# Patient Record
Sex: Female | Born: 1990 | Race: Black or African American | Hispanic: No | Marital: Single | Smoking: Never smoker
Health system: Southern US, Community
[De-identification: ages and names within clinical notes are randomized; demographics above are authoritative.]

## PROBLEM LIST (undated history)

## (undated) DIAGNOSIS — R519 Headache, unspecified: Secondary | ICD-10-CM

## (undated) DIAGNOSIS — J45909 Unspecified asthma, uncomplicated: Secondary | ICD-10-CM

## (undated) DIAGNOSIS — R51 Headache: Secondary | ICD-10-CM

## (undated) HISTORY — PX: NO PAST SURGERIES: SHX2092

---

## 2015-06-04 NOTE — L&D Delivery Note (Signed)
Delivery Note After 10  Minutes of pushing, at 9:36 PM a viable female was delivered via Vaginal, Spontaneous Delivery (Presentation: OA).  APGAR: 8, 9;  After 2 minutes, the cord was clamped and cut. 40 units of pitocin diluted in 1000cc LR was infused rapidly IV.  The placenta separated spontaneously and delivered via CCT and maternal pushing effort.  It was inspected and appears to be intact with a 3 VC.   Placenta status: delivered spontaneously in tact Cord: 3 vessel, without complication.   Anesthesia:  Epidural Episiotomy: None Lacerations: None Suture Repair: NA Est. Blood Loss (mL): 18  Mom to postpartum.  Baby to Couplet care / Skin to Skin.  All instruments and gauze accounted for.  Andres Ege, MD, PGY-1, MPH 01/04/2016, 9:49 PM  I was present for the above and agree.  CRESENZO-DISHMAN,Crystian Frith

## 2015-10-03 ENCOUNTER — Encounter: Payer: Self-pay | Admitting: Certified Nurse Midwife

## 2015-10-03 ENCOUNTER — Ambulatory Visit (INDEPENDENT_AMBULATORY_CARE_PROVIDER_SITE_OTHER): Payer: Medicaid Other | Admitting: Certified Nurse Midwife

## 2015-10-03 VITALS — BP 125/80 | HR 106 | Wt 191.0 lb

## 2015-10-03 DIAGNOSIS — J45909 Unspecified asthma, uncomplicated: Secondary | ICD-10-CM

## 2015-10-03 DIAGNOSIS — J4520 Mild intermittent asthma, uncomplicated: Secondary | ICD-10-CM | POA: Diagnosis not present

## 2015-10-03 DIAGNOSIS — K5901 Slow transit constipation: Secondary | ICD-10-CM | POA: Diagnosis not present

## 2015-10-03 DIAGNOSIS — O9989 Other specified diseases and conditions complicating pregnancy, childbirth and the puerperium: Secondary | ICD-10-CM | POA: Diagnosis not present

## 2015-10-03 DIAGNOSIS — Z3492 Encounter for supervision of normal pregnancy, unspecified, second trimester: Secondary | ICD-10-CM

## 2015-10-03 DIAGNOSIS — O219 Vomiting of pregnancy, unspecified: Secondary | ICD-10-CM

## 2015-10-03 DIAGNOSIS — Z3482 Encounter for supervision of other normal pregnancy, second trimester: Secondary | ICD-10-CM | POA: Insufficient documentation

## 2015-10-03 DIAGNOSIS — O99519 Diseases of the respiratory system complicating pregnancy, unspecified trimester: Secondary | ICD-10-CM

## 2015-10-03 LAB — POCT URINALYSIS DIPSTICK
Bilirubin, UA: NEGATIVE
Blood, UA: NEGATIVE
Glucose, UA: NEGATIVE
KETONES UA: NEGATIVE
NITRITE UA: NEGATIVE
PH UA: 5
Spec Grav, UA: 1.02
Urobilinogen, UA: 1

## 2015-10-03 MED ORDER — FUSION PLUS PO CAPS: 1.0000 | ORAL_CAPSULE | Freq: Every day | ORAL | Status: DC

## 2015-10-03 MED ORDER — DOCUSATE SODIUM 100 MG PO CAPS: 100.0000 mg | ORAL_CAPSULE | Freq: Two times a day (BID) | ORAL | Status: DC

## 2015-10-03 MED ORDER — ONDANSETRON HCL 4 MG PO TABS: 4.0000 mg | ORAL_TABLET | Freq: Every day | ORAL | Status: DC | PRN

## 2015-10-03 MED ORDER — ALBUTEROL SULFATE HFA 108 (90 BASE) MCG/ACT IN AERS: 2.0000 | INHALATION_SPRAY | Freq: Four times a day (QID) | RESPIRATORY_TRACT | Status: AC | PRN

## 2015-10-03 MED ORDER — VITAFOL GUMMIES 3.33-0.333-34.8 MG PO CHEW: 3.0000 | CHEWABLE_TABLET | Freq: Every day | ORAL | Status: DC

## 2015-10-03 NOTE — Progress Notes (Signed)
Subjective:    Georga BoraClayvonna Brownrigg is being seen today for her first obstetrical visit.  This is not a planned pregnancy. She is at 6967w2d gestation. Her obstetrical history is significant for Asthma, not using any medications. Relationship with FOB: not involved. Patient does intend to breast feed. Pregnancy history fully reviewed.  Not currently employed.    The information documented in the HPI was reviewed and verified.  Menstrual History: OB History    Gravida Para Term Preterm AB TAB SAB Ectopic Multiple Living   3 1 1  1     1      NSVD: AGA.    Patient's last menstrual period was 04/16/2015.    No past medical history on file.  No past surgical history on file.   (Not in a hospital admission) Not on File  Social History  Substance Use Topics  . Smoking status: Not on file  . Smokeless tobacco: Not on file  . Alcohol Use: Not on file    No family history on file.   Review of Systems Constitutional: negative for weight loss Gastrointestinal: negative for vomiting Genitourinary:negative for genital lesions and vaginal discharge and dysuria Musculoskeletal:negative for back pain Behavioral/Psych: negative for abusive relationship, depression, illegal drug usage and tobacco use    Objective:    BP 125/80 mmHg  Pulse 106  Wt 191 lb (86.637 kg)  LMP 04/16/2015 General Appearance:    Alert, cooperative, no distress, appears stated age  Head:    Normocephalic, without obvious abnormality, atraumatic  Eyes:    PERRL, conjunctiva/corneas clear, EOM's intact, fundi    benign, both eyes  Ears:    Normal TM's and external ear canals, both ears  Nose:   Nares normal, septum midline, mucosa normal, no drainage    or sinus tenderness  Throat:   Lips, mucosa, and tongue normal; teeth and gums normal  Neck:   Supple, symmetrical, trachea midline, no adenopathy;    thyroid:  no enlargement/tenderness/nodules; no carotid   bruit or JVD  Back:     Symmetric, no curvature, ROM normal,  no CVA tenderness  Lungs:     Clear to auscultation bilaterally, respirations unlabored  Chest Wall:    No tenderness or deformity   Heart:    Regular rate and rhythm, S1 and S2 normal, no murmur, rub   or gallop  Breast Exam:    No tenderness, masses, or nipple abnormality  Abdomen:     Soft, non-tender, bowel sounds active all four quadrants,    no masses, no organomegaly  Genitalia:    Normal female without lesion, discharge or tenderness  Extremities:   Extremities normal, atraumatic, no cyanosis or edema  Pulses:   2+ and symmetric all extremities  Skin:   Skin color, texture, turgor normal, no rashes or lesions  Lymph nodes:   Cervical, supraclavicular, and axillary nodes normal  Neurologic:   CNII-XII intact, normal strength, sensation and reflexes    throughout      Lab Review Urine pregnancy test Labs reviewed no Radiologic studies reviewed no Assessment:    Pregnancy at 4367w2d weeks   Transfer from [facility]:XWRUEAVJ:records requested  Plan:      Prenatal vitamins.  Counseling provided regarding continued use of seat belts, cessation of alcohol consumption, smoking or use of illicit drugs; infection precautions i.e., influenza/TDAP immunizations, toxoplasmosis,CMV, parvovirus, listeria and varicella; workplace safety, exercise during pregnancy; routine dental care, safe medications, sexual activity, hot tubs, saunas, pools, travel, caffeine use, fish and methlymercury, potential toxins,  hair treatments, varicose veins Weight gain recommendations per IOM guidelines reviewed: underweight/BMI< 18.5--> gain 28 - 40 lbs; normal weight/BMI 18.5 - 24.9--> gain 25 - 35 lbs; overweight/BMI 25 - 29.9--> gain 15 - 25 lbs; obese/BMI >30->gain  11 - 20 lbs Problem list reviewed and updated. FIRST/CF mutation testing/NIPT/QUAD SCREEN/fragile X/Ashkenazi Jewish population testing/Spinal muscular atrophy discussed: results reviewed. Role of ultrasound in pregnancy discussed; fetal survey: no records  presently, will review when recieved. Amniocentesis discussed: not indicated. VBAC calculator score: VBAC consent form provided Meds ordered this encounter  Medications  . Prenatal MV-Min-Fe Fum-FA-DHA (PRENATAL 1 PO)    Sig: Take by mouth.   Orders Placed This Encounter  Procedures  . POCT urinalysis dipstick    Follow up in 4 weeks with OGTT. 50% of 30 min visit spent on counseling and coordination of care.

## 2015-10-05 LAB — PRENATAL PROFILE I(LABCORP)
ANTIBODY SCREEN: NEGATIVE
BASOS: 0 %
Basophils Absolute: 0 10*3/uL (ref 0.0–0.2)
EOS (ABSOLUTE): 0.1 10*3/uL (ref 0.0–0.4)
EOS: 1 %
HEMATOCRIT: 33.7 % — AB (ref 34.0–46.6)
HEP B S AG: NEGATIVE
Hemoglobin: 11.3 g/dL (ref 11.1–15.9)
IMMATURE GRANS (ABS): 0.1 10*3/uL (ref 0.0–0.1)
Immature Granulocytes: 1 %
LYMPHS: 14 %
Lymphocytes Absolute: 1.6 10*3/uL (ref 0.7–3.1)
MCH: 28.9 pg (ref 26.6–33.0)
MCHC: 33.5 g/dL (ref 31.5–35.7)
MCV: 86 fL (ref 79–97)
MONOCYTES: 7 %
Monocytes Absolute: 0.8 10*3/uL (ref 0.1–0.9)
Neutrophils Absolute: 9 10*3/uL — ABNORMAL HIGH (ref 1.4–7.0)
Neutrophils: 77 %
Platelets: 306 10*3/uL (ref 150–379)
RBC: 3.91 x10E6/uL (ref 3.77–5.28)
RDW: 14.5 % (ref 12.3–15.4)
RPR: NONREACTIVE
RUBELLA: 4.25 {index} (ref >0.99)
Rh Factor: POSITIVE
WBC: 11.6 10*3/uL — ABNORMAL HIGH (ref 3.4–10.8)

## 2015-10-05 LAB — CULTURE, OB URINE

## 2015-10-05 LAB — HEMOGLOBINOPATHY EVALUATION
HEMOGLOBIN A2 QUANTITATION: 2.3 % (ref 0.7–3.1)
HGB C: 0 %
HGB S: 0 %
Hemoglobin F Quantitation: 0 % (ref 0.0–2.0)
Hgb A: 97.7 % (ref 94.0–98.0)

## 2015-10-05 LAB — VARICELLA ZOSTER ANTIBODY, IGG: Varicella zoster IgG: <135 {index} — ABNORMAL LOW

## 2015-10-05 LAB — URINE CULTURE, OB REFLEX

## 2015-10-05 LAB — TSH: TSH: 1.51 u[IU]/mL (ref 0.450–4.500)

## 2015-10-31 ENCOUNTER — Other Ambulatory Visit: Payer: Medicaid Other

## 2015-10-31 ENCOUNTER — Ambulatory Visit (INDEPENDENT_AMBULATORY_CARE_PROVIDER_SITE_OTHER): Payer: Medicaid Other | Admitting: Certified Nurse Midwife

## 2015-10-31 VITALS — BP 123/77 | HR 79 | Wt 195.0 lb

## 2015-10-31 DIAGNOSIS — Z3483 Encounter for supervision of other normal pregnancy, third trimester: Secondary | ICD-10-CM

## 2015-10-31 LAB — POCT URINALYSIS DIPSTICK
Bilirubin, UA: NEGATIVE
Blood, UA: NEGATIVE
Glucose, UA: NEGATIVE
KETONES UA: NEGATIVE
LEUKOCYTES UA: NEGATIVE
Nitrite, UA: NEGATIVE
PH UA: 6
PROTEIN UA: NEGATIVE
Spec Grav, UA: 1.01
UROBILINOGEN UA: NEGATIVE

## 2015-10-31 NOTE — Progress Notes (Signed)
Subjective:    Joan Francis is a 25 y.o. female being seen today for her obstetrical visit. She is at 2129w2d gestation. Patient reports backache, no bleeding, no cramping, no leaking and occasional contractions. Fetal movement: normal.  Problem List Items Addressed This Visit    None     Patient Active Problem List   Diagnosis Date Noted  . Encounter for supervision of other normal pregnancy in second trimester 10/03/2015  . Asthma affecting pregnancy, antepartum 10/03/2015   Objective:    BP 123/77 mmHg  Pulse 79  Wt 195 lb (88.451 kg)  LMP 04/16/2015 FHT:  140 BPM  Uterine Size: size equals dates  Presentation: cephalic     Assessment:    Pregnancy @ 229w2d weeks   Round ligament of pregnancy  Plan:    Rx: maternity support belt   labs reviewed, problem list updated Consent signed. GBS sent TDAP offered  Rhogam given for RH negative Pediatrician: discussed. Infant feeding: plans to breastfeed. Maternity leave: discussed. Cigarette smoking: never smoked. No orders of the defined types were placed in this encounter.   No orders of the defined types were placed in this encounter.   Follow up in 2 Weeks.

## 2015-10-31 NOTE — Progress Notes (Signed)
Pt has some lower abdominal pain/tenderness.

## 2015-11-01 ENCOUNTER — Other Ambulatory Visit: Payer: Self-pay | Admitting: Certified Nurse Midwife

## 2015-11-01 LAB — CBC
HEMATOCRIT: 33.1 % — AB (ref 34.0–46.6)
Hemoglobin: 10.7 g/dL — ABNORMAL LOW (ref 11.1–15.9)
MCH: 27.6 pg (ref 26.6–33.0)
MCHC: 32.3 g/dL (ref 31.5–35.7)
MCV: 86 fL (ref 79–97)
Platelets: 300 10*3/uL (ref 150–379)
RBC: 3.87 x10E6/uL (ref 3.77–5.28)
RDW: 14.8 % (ref 12.3–15.4)
WBC: 9.9 10*3/uL (ref 3.4–10.8)

## 2015-11-01 LAB — GLUCOSE TOLERANCE, 2 HOURS W/ 1HR
Glucose, 1 hour: 107 mg/dL (ref 65–179)
Glucose, 2 hour: 98 mg/dL (ref 65–152)
Glucose, Fasting: 76 mg/dL (ref 65–91)

## 2015-11-01 LAB — RPR: RPR Ser Ql: NONREACTIVE

## 2015-11-01 LAB — HIV ANTIBODY (ROUTINE TESTING W REFLEX): HIV Screen 4th Generation wRfx: NONREACTIVE

## 2015-11-06 ENCOUNTER — Ambulatory Visit (HOSPITAL_COMMUNITY)
Admission: RE | Admit: 2015-11-06 | Discharge: 2015-11-06 | Disposition: A | Discharge status: Home / Self Care | Payer: Medicaid Other | Source: Ambulatory Visit | Attending: Certified Nurse Midwife | Admitting: Certified Nurse Midwife

## 2015-11-06 DIAGNOSIS — Z3483 Encounter for supervision of other normal pregnancy, third trimester: Secondary | ICD-10-CM

## 2015-11-06 DIAGNOSIS — J45909 Unspecified asthma, uncomplicated: Secondary | ICD-10-CM | POA: Insufficient documentation

## 2015-11-06 DIAGNOSIS — O99513 Diseases of the respiratory system complicating pregnancy, third trimester: Secondary | ICD-10-CM | POA: Insufficient documentation

## 2015-11-06 DIAGNOSIS — Z36 Encounter for antenatal screening of mother: Secondary | ICD-10-CM | POA: Insufficient documentation

## 2015-11-06 DIAGNOSIS — Z3A29 29 weeks gestation of pregnancy: Secondary | ICD-10-CM | POA: Diagnosis not present

## 2015-11-07 ENCOUNTER — Other Ambulatory Visit: Payer: Self-pay | Admitting: Certified Nurse Midwife

## 2015-11-14 ENCOUNTER — Ambulatory Visit (INDEPENDENT_AMBULATORY_CARE_PROVIDER_SITE_OTHER): Payer: Medicaid Other | Admitting: Certified Nurse Midwife

## 2015-11-14 VITALS — BP 125/80 | HR 83 | Wt 197.0 lb

## 2015-11-14 DIAGNOSIS — Z3483 Encounter for supervision of other normal pregnancy, third trimester: Secondary | ICD-10-CM

## 2015-11-14 LAB — POCT URINALYSIS DIPSTICK
BILIRUBIN UA: NEGATIVE
GLUCOSE UA: NEGATIVE
Ketones, UA: NEGATIVE
NITRITE UA: NEGATIVE
Protein, UA: NEGATIVE
RBC UA: NEGATIVE
Spec Grav, UA: <=1.005
UROBILINOGEN UA: NEGATIVE
pH, UA: 6

## 2015-11-14 NOTE — Progress Notes (Signed)
I agree with note by NP Student Peri MarisAndrew Jojo Geving.  Was present for exam.  R.Denney CNM  Subjective:    Joan Francis is a 25 y.o. female being seen today for her obstetrical visit. She is at 6515w2d gestation. Patient reports backache, no bleeding, no leaking and occasional contractions. Fetal movement: normal.  Patient expressed interest in tubal ligation.  Educated patient about requirements and risk with tubal ligation.  Desires IUD (Mirena or Paraguard) after delivery.    Problem List Items Addressed This Visit    None    Visit Diagnoses    Encounter for supervision of other normal pregnancy in third trimester    -  Primary    Relevant Orders    POCT urinalysis dipstick (Completed)      Patient Active Problem List   Diagnosis Date Noted  . Encounter for supervision of other normal pregnancy in second trimester 10/03/2015  . Asthma affecting pregnancy, antepartum 10/03/2015   Objective:    BP 125/80 mmHg  Pulse 83  Wt 197 lb (89.359 kg)  LMP 04/16/2015 FHT:  150 BPM  Uterine Size: size equals dates  Presentation: breech     Assessment:    Pregnancy @ 5615w2d weeks    Doing well   Plan:     labs reviewed, problem list updated Consent signed. GBS planning TDAP offered  Rhogam given for RH negative Pediatrician: discussed.  Orders Placed This Encounter  Procedures  . POCT urinalysis dipstick   No orders of the defined types were placed in this encounter.   Follow up in 2 Weeks.

## 2015-11-14 NOTE — Progress Notes (Signed)
Subjective:    Georga BoraClayvonna Creswell is a 25 y.o. female being seen today for her obstetrical visit. She is at 6044w2d gestation. Patient reports no complaints. Fetal movement: normal.  Problem List Items Addressed This Visit    None    Visit Diagnoses    Encounter for supervision of other normal pregnancy in third trimester    -  Primary    Relevant Orders    POCT urinalysis dipstick (Completed)      Patient Active Problem List   Diagnosis Date Noted  . Encounter for supervision of other normal pregnancy in second trimester 10/03/2015  . Asthma affecting pregnancy, antepartum 10/03/2015   Objective:    BP 125/80 mmHg  Pulse 83  Wt 197 lb (89.359 kg)  LMP 04/16/2015             Assessment:    Pregnancy @ 9344w2d weeks   Doing well  Plan:     labs reviewed, problem list updated Consent signed. GBS planning TDAP offered  Rhogam given for RH negative Pediatrician: discussed. Infant feeding: plans to breastfeed. Maternity leave: discussed. Cigarette smoking: never smoked. Orders Placed This Encounter  Procedures  . POCT urinalysis dipstick   No orders of the defined types were placed in this encounter.   Follow up in 2 Weeks.

## 2015-11-28 ENCOUNTER — Ambulatory Visit (INDEPENDENT_AMBULATORY_CARE_PROVIDER_SITE_OTHER): Payer: Medicaid Other | Admitting: Certified Nurse Midwife

## 2015-11-28 VITALS — BP 116/60 | HR 91 | Wt 197.0 lb

## 2015-11-28 DIAGNOSIS — K219 Gastro-esophageal reflux disease without esophagitis: Secondary | ICD-10-CM

## 2015-11-28 DIAGNOSIS — O99613 Diseases of the digestive system complicating pregnancy, third trimester: Secondary | ICD-10-CM

## 2015-11-28 DIAGNOSIS — Z3483 Encounter for supervision of other normal pregnancy, third trimester: Secondary | ICD-10-CM

## 2015-11-28 LAB — POCT URINALYSIS DIPSTICK
Bilirubin, UA: NEGATIVE
Blood, UA: NEGATIVE
GLUCOSE UA: NEGATIVE
KETONES UA: NEGATIVE
Nitrite, UA: NEGATIVE
Protein, UA: NEGATIVE
SPEC GRAV UA: 1.01
UROBILINOGEN UA: NEGATIVE
pH, UA: 6

## 2015-11-28 MED ORDER — OMEPRAZOLE 20 MG PO CPDR: 20.0000 mg | DELAYED_RELEASE_CAPSULE | Freq: Two times a day (BID) | ORAL | Status: DC

## 2015-11-28 NOTE — Progress Notes (Signed)
I agree with note by NP Student Andrew Brake.  Was present for exam.  R.Rennae Ferraiolo CNM 

## 2015-11-28 NOTE — Progress Notes (Signed)
Subjective:    Joan Francis iGeorga Boras a 25 y.o. female being seen today for her obstetrical visit. She is at 7669w2d gestation. Patient reports backache, fatigue, heartburn, no contractions, no cramping, no leaking and can't eat much, feels like getting the stomach flu, not feeling hungry, feeling hot, feels like her labia is going to tear, sharp pains on RLQ. Fetal movement: normal.  Problem List Items Addressed This Visit    None    Visit Diagnoses    Encounter for supervision of other normal pregnancy in third trimester    -  Primary    Relevant Orders    POCT urinalysis dipstick (Completed)    US MFM OB FOLLOW UP    Gastroesophageal reflux during pregnancy in third trimester, antepartum        Relevant Medications    omeprazole (PRILOSEC) 20 MG capsule      Patient Active Problem List   Diagnosis Date Noted  . Encounter for supervision of other normal pregnancy in second trimester 10/03/2015  . Asthma affecting pregnancy, antepartum 10/03/2015   Objective:    BP 153/84 mmHg  Pulse 91  Wt 197 lb (89.359 kg)  LMP 04/16/2015 FHT:  150 BPM  Uterine Size: size equals dates  Presentation: unsure     1 cm dilated.  Assessment:    Pregnancy @ 7969w2d weeks    Round ligament pain.  Normal pregnancy discomfort.  Plan:     labs reviewed, problem list updated Reinforce of maternity support belt & Tums. Consent signed. GBS sent TDAP offered  Rhogam given for RH negative Pediatrician: discussed. Infant feeding: plans to breastfeed. Maternity leave: discussed. Cigarette smoking: never smoked. Orders Placed This Encounter  Procedures  . US MFM OB FOLLOW UP    Standing Status: Future     Number of Occurrences:      Standing Expiration Date: 01/27/2017    Order Specific Question:  Reason for Exam (SYMPTOM  OR DIAGNOSIS REQUIRED)    Answer:  f/u US for growth    Order Specific Question:  Preferred imaging location?    Answer:  [facility]  . POCT urinalysis dipstick    Meds ordered this encounter  Medications  . omeprazole (PRILOSEC) 20 MG capsule    Sig: Take 1 capsule (20 mg total) by mouth 2 (two) times daily before a meal.    Dispense:  60 capsule    Refill:  4   Follow up in 2 Weeks.

## 2015-12-04 ENCOUNTER — Ambulatory Visit (HOSPITAL_COMMUNITY)
Admission: RE | Admit: 2015-12-04 | Discharge: 2015-12-04 | Disposition: A | Discharge status: Home / Self Care | Payer: Medicaid Other | Source: Ambulatory Visit | Attending: Certified Nurse Midwife | Admitting: Certified Nurse Midwife

## 2015-12-04 DIAGNOSIS — O9989 Other specified diseases and conditions complicating pregnancy, childbirth and the puerperium: Secondary | ICD-10-CM | POA: Insufficient documentation

## 2015-12-04 DIAGNOSIS — J45909 Unspecified asthma, uncomplicated: Secondary | ICD-10-CM | POA: Diagnosis present

## 2015-12-04 DIAGNOSIS — Z36 Encounter for antenatal screening of mother: Secondary | ICD-10-CM | POA: Diagnosis not present

## 2015-12-04 DIAGNOSIS — O0933 Supervision of pregnancy with insufficient antenatal care, third trimester: Secondary | ICD-10-CM | POA: Diagnosis not present

## 2015-12-04 DIAGNOSIS — Z3483 Encounter for supervision of other normal pregnancy, third trimester: Secondary | ICD-10-CM

## 2015-12-04 DIAGNOSIS — Z3A33 33 weeks gestation of pregnancy: Secondary | ICD-10-CM | POA: Diagnosis not present

## 2015-12-07 ENCOUNTER — Other Ambulatory Visit: Payer: Self-pay | Admitting: Certified Nurse Midwife

## 2015-12-12 ENCOUNTER — Ambulatory Visit (INDEPENDENT_AMBULATORY_CARE_PROVIDER_SITE_OTHER): Payer: Medicaid Other | Admitting: Certified Nurse Midwife

## 2015-12-12 VITALS — BP 130/83 | HR 106 | Wt 198.8 lb

## 2015-12-12 DIAGNOSIS — Z3483 Encounter for supervision of other normal pregnancy, third trimester: Secondary | ICD-10-CM

## 2015-12-12 LAB — POCT URINALYSIS DIPSTICK
BILIRUBIN UA: NEGATIVE
Blood, UA: NEGATIVE
Glucose, UA: NEGATIVE
Leukocytes, UA: NEGATIVE
Nitrite, UA: NEGATIVE
SPEC GRAV UA: 1.02
Urobilinogen, UA: NEGATIVE
pH, UA: 5

## 2015-12-12 NOTE — Progress Notes (Signed)
I agree with note by NP Student Andrew Brake.  Was present for exam.  R.Denney CNM 

## 2015-12-12 NOTE — Progress Notes (Signed)
Subjective:    Joan Francis is a 25 y.o. female being seen today for her obstetrical visit. She is at 7020w2d gestation. Patient reports backache, fatigue, no bleeding and no contractions.  Had contractions on 12-04-2015, took warm bath and felt better. Fetal movement: normal.  Problem List Items Addressed This Visit    None    Visit Diagnoses    Encounter for supervision of other normal pregnancy in third trimester    -  Primary    Relevant Orders    POCT urinalysis dipstick      Patient Active Problem List   Diagnosis Date Noted  . Encounter for supervision of other normal pregnancy in second trimester 10/03/2015  . Asthma affecting pregnancy, antepartum 10/03/2015   Objective:    BP 130/83 mmHg  Pulse 106  Wt 198 lb 12.8 oz (90.175 kg)  LMP 04/16/2015 FHT:  140 BPM  Uterine Size: size equals dates  Presentation: cephalic     Assessment:    Pregnancy @ 3320w2d weeks  Back pain in pregnancy. Plan:     labs reviewed, problem list updated Consent signed. GBS sent TDAP offered  Rhogam given for RH negative Pediatrician: discussed. Infant feeding: plans to breastfeed. Maternity leave: discussed.  Orders Placed This Encounter  Procedures  . POCT urinalysis dipstick   No orders of the defined types were placed in this encounter.   Follow up in 1 Weeks with GBS.

## 2015-12-20 ENCOUNTER — Ambulatory Visit (INDEPENDENT_AMBULATORY_CARE_PROVIDER_SITE_OTHER): Payer: Medicaid Other | Admitting: Certified Nurse Midwife

## 2015-12-20 VITALS — BP 119/77 | HR 101 | Temp 97.7°F | Wt 200.0 lb

## 2015-12-20 DIAGNOSIS — Z3483 Encounter for supervision of other normal pregnancy, third trimester: Secondary | ICD-10-CM

## 2015-12-20 LAB — POCT URINALYSIS DIPSTICK
Bilirubin, UA: NEGATIVE
Blood, UA: NEGATIVE
Glucose, UA: NEGATIVE
Nitrite, UA: NEGATIVE
PH UA: 5
SPEC GRAV UA: 1.025
UROBILINOGEN UA: NEGATIVE

## 2015-12-20 LAB — OB RESULTS CONSOLE GBS: STREP GROUP B AG: NEGATIVE

## 2015-12-20 NOTE — Progress Notes (Signed)
Subjective:    Joan Francis is a 25 y.o. female being seen today for her obstetrical visit. She is at 9898w3d gestation. Patient reports backache, no bleeding, no leaking, occasional contractions and insomnia; has not tried anything for the insomnia. Fetal movement: normal.  Problem List Items Addressed This Visit    None    Visit Diagnoses    Encounter for supervision of other normal pregnancy in third trimester    -  Primary    Relevant Orders    POCT urinalysis dipstick (Completed)      Patient Active Problem List   Diagnosis Date Noted  . Encounter for supervision of other normal pregnancy in second trimester 10/03/2015  . Asthma affecting pregnancy, antepartum 10/03/2015   Objective:    BP 119/77 mmHg  Pulse 101  Temp(Src) 97.7 F (36.5 C)  Wt 200 lb (90.719 kg)  LMP 04/16/2015 FHT:  145 BPM  Uterine Size: size equals dates  Presentation: cephalic   Cervix: 1 cm, posterior, medium, -3 station  Assessment:    Pregnancy @ 6898w3d weeks   Plan:     labs reviewed, problem list updated Consent signed. GBS sent TDAP offered  Rhogam given for RH negative Pediatrician: discussed. Infant feeding: plans to breastfeed. Maternity leave: discussed and forms done. Cigarette smoking: never smoked. Orders Placed This Encounter  Procedures  . POCT urinalysis dipstick   No orders of the defined types were placed in this encounter.   Follow up in 1 Week.

## 2015-12-20 NOTE — Assessment & Plan Note (Signed)
  Clinic  [facility] Prenatal Labs  Dating  LMP Blood type: O/Positive/-- (05/02 1513)   Genetic Screen 1 Screen:    AFP:     Quad:     NIPS: Antibody:Negative (05/02 1513)  Anatomic US  Normal  Rubella: 4.25 (05/02 1513)  GTT Early:               Third trimester:  RPR: Non Reactive (05/30 1040)   Flu vaccine  HBsAg: Negative (05/02 1513)   TDaP vaccine                                               Rhogam: N/A HIV: Non Reactive (05/30 1040)   Baby Food                   Breast                            GBS: (For PCN allergy, check sensitivities)  Contraception   IUD PP Pap:  Circumcision    Pediatrician    Support Person

## 2015-12-20 NOTE — Addendum Note (Signed)
Addended by: Marya LandryFOSTER, Correy Weidner D on: 12/20/2015 10:38 AM   Modules accepted: Orders

## 2015-12-22 LAB — STREP GP B NAA: Strep Gp B NAA: NEGATIVE

## 2015-12-27 LAB — NUSWAB VG+, CANDIDA 6SP
CANDIDA GLABRATA, NAA: NEGATIVE
CANDIDA KRUSEI, NAA: NEGATIVE
CANDIDA LUSITANIAE, NAA: NEGATIVE
CANDIDA PARAPSILOSIS, NAA: NEGATIVE
CHLAMYDIA TRACHOMATIS, NAA: NEGATIVE
Candida albicans, NAA: NEGATIVE
Candida tropicalis, NAA: NEGATIVE
NEISSERIA GONORRHOEAE, NAA: NEGATIVE
Trich vag by NAA: NEGATIVE

## 2015-12-28 ENCOUNTER — Ambulatory Visit (INDEPENDENT_AMBULATORY_CARE_PROVIDER_SITE_OTHER): Payer: Medicaid Other | Admitting: Certified Nurse Midwife

## 2015-12-28 VITALS — BP 119/79 | HR 105 | Temp 97.2°F | Wt 200.0 lb

## 2015-12-28 DIAGNOSIS — Z3483 Encounter for supervision of other normal pregnancy, third trimester: Secondary | ICD-10-CM

## 2015-12-28 DIAGNOSIS — Z3493 Encounter for supervision of normal pregnancy, unspecified, third trimester: Secondary | ICD-10-CM

## 2015-12-28 NOTE — Progress Notes (Signed)
Subjective:    Joan Francis is a 25 y.o. female being seen today for her obstetrical visit. She is at [redacted]w[redacted]d gestation. Patient reports no bleeding, no leaking and occasional contractions. Fetal movement: normal.  Problem List Items Addressed This Visit      Other   Encounter for supervision of other normal pregnancy in third trimester     Clinic  [facility] Prenatal Labs  Dating  LMP Blood type: O/Positive/-- (05/02 1513)   Genetic Screen 1 Screen:    AFP:     Quad:     NIPS: Antibody:Negative (05/02 1513)  Anatomic [place]  normal Rubella: 4.25 (05/02 1513)  GTT Early:               Third trimester: Normal 2 hr RPR: Non Reactive (05/30 1040)   Flu vaccine  HBsAg: Negative (05/02 1513)   TDaP vaccine                                               Rhogam:N/A HIV: Non Reactive (05/30 1040)   Baby Food                   Breast                            EBR:[id number] (07/19 1059)(For PCN allergy, check sensitivities)  Contraception  IUD Pap:  Circumcision  [facility]   Pediatrician  [facility]   Support Person  Aunt          Other Visit Diagnoses    Prenatal care in third trimester    -  Primary   Relevant Orders   POCT Urinalysis Dipstick     Patient Active Problem List   Diagnosis Date Noted  . Encounter for supervision of other normal pregnancy in third trimester 12/20/2015  . Encounter for supervision of other normal pregnancy in second trimester 10/03/2015  . Asthma affecting pregnancy, antepartum 10/03/2015   Objective:    BP 119/79   Pulse (!) 105   Temp 97.2 F (36.2 C)   Wt 200 lb (90.7 kg)   LMP 04/16/2015  FHT:  150 BPM  Uterine Size: 37 cm and size equals dates  Presentation: cephalic   Cervix: 1cm, long, thick, posterior  Assessment:    Pregnancy @ [redacted]w[redacted]d weeks    Plan:     labs reviewed, problem list updated Consent signed. GBS results reviewed TDAP offered  Rhogam given for RH negative Pediatrician: discussed. Infant feeding: plans to  breastfeed. Maternity leave: discussed. Cigarette smoking: never smoked. Orders Placed This Encounter  Procedures  . POCT Urinalysis Dipstick   No orders of the defined types were placed in this encounter.  Follow up in 1 Week.

## 2015-12-28 NOTE — Progress Notes (Signed)
Patient states that she has pelvic pressure, and pain in her vagina.

## 2015-12-28 NOTE — Assessment & Plan Note (Signed)
  Clinic  [facility] Prenatal Labs  Dating  LMP Blood type: O/Positive/-- (05/02 1513)   Genetic Screen 1 Screen:    AFP:     Quad:     NIPS: Antibody:Negative (05/02 1513)  Anatomic [place]  normal Rubella: 4.25 (05/02 1513)  GTT Early:               Third trimester: Normal 2 hr RPR: Non Reactive (05/30 1040)   Flu vaccine  HBsAg: Negative (05/02 1513)   TDaP vaccine                                               Rhogam:N/A HIV: Non Reactive (05/30 1040)   Baby Food                   Breast                            EGB:[id number] (07/19 1059)(For PCN allergy, check sensitivities)  Contraception  IUD Pap:  Circumcision  [facility]   Pediatrician  [facility]   Support Person  Aunt

## 2016-01-02 ENCOUNTER — Ambulatory Visit (INDEPENDENT_AMBULATORY_CARE_PROVIDER_SITE_OTHER): Payer: Medicaid Other | Admitting: Obstetrics

## 2016-01-02 VITALS — BP 114/75 | HR 97 | Temp 97.7°F | Wt 201.2 lb

## 2016-01-02 DIAGNOSIS — Z3493 Encounter for supervision of normal pregnancy, unspecified, third trimester: Secondary | ICD-10-CM | POA: Diagnosis not present

## 2016-01-02 DIAGNOSIS — K219 Gastro-esophageal reflux disease without esophagitis: Secondary | ICD-10-CM

## 2016-01-02 LAB — POCT URINALYSIS DIPSTICK
Bilirubin, UA: NEGATIVE
Blood, UA: NEGATIVE
GLUCOSE UA: NEGATIVE
KETONES UA: NEGATIVE
Nitrite, UA: NEGATIVE
SPEC GRAV UA: 1.01
UROBILINOGEN UA: 0.2
pH, UA: 6

## 2016-01-02 MED ORDER — OMEPRAZOLE 20 MG PO CPDR: 20.0000 mg | DELAYED_RELEASE_CAPSULE | Freq: Two times a day (BID) | ORAL | 5 refills | Status: DC

## 2016-01-02 NOTE — Progress Notes (Signed)
Pt c/o pain and pressure in pelvic area

## 2016-01-02 NOTE — Addendum Note (Signed)
Addended by: Lear Ng on: 01/02/2016 03:41 PM   Modules accepted: Orders

## 2016-01-02 NOTE — Progress Notes (Signed)
Subjective:    Joan Francis is a 25 y.o. female being seen today for her obstetrical visit. She is at [redacted]w[redacted]d gestation. Patient reports occasional contractions. Fetal movement: normal.  Problem List Items Addressed This Visit    None    Visit Diagnoses   None.    Patient Active Problem List   Diagnosis Date Noted  . Encounter for supervision of other normal pregnancy in third trimester 12/20/2015  . Encounter for supervision of other normal pregnancy in second trimester 10/03/2015  . Asthma affecting pregnancy, antepartum 10/03/2015    Objective:    BP 114/75   Pulse 97   Temp 97.7 F (36.5 C)   Wt 201 lb 3.2 oz (91.3 kg)   LMP 04/16/2015  FHT: 150 BPM  Uterine Size: size equals dates  Presentations: unsure    Assessment:    Pregnancy @ [redacted]w[redacted]d weeks   Plan:   Plans for delivery: Vaginal anticipated; labs reviewed; problem list updated Counseling: Consent signed. Infant feeding: plans to breastfeed. Cigarette smoking: never smoked. L&D discussion: symptoms of labor, discussed when to call, discussed what number to call, anesthetic/analgesic options reviewed and delivering clinician:  plans no preference. Postpartum supports and preparation: circumcision discussed and contraception plans discussed.  Follow up in 1 Week.

## 2016-01-04 ENCOUNTER — Encounter (HOSPITAL_COMMUNITY): Payer: Self-pay | Admitting: *Deleted

## 2016-01-04 ENCOUNTER — Inpatient Hospital Stay (HOSPITAL_COMMUNITY): Payer: Medicaid Other | Admitting: Anesthesiology

## 2016-01-04 ENCOUNTER — Inpatient Hospital Stay (HOSPITAL_COMMUNITY)
Admission: AD | Admit: 2016-01-04 | Discharge: 2016-01-04 | Disposition: A | Discharge status: Home / Self Care | Payer: Medicaid Other | Source: Ambulatory Visit | Attending: Obstetrics and Gynecology | Admitting: Obstetrics and Gynecology

## 2016-01-04 ENCOUNTER — Inpatient Hospital Stay (HOSPITAL_COMMUNITY)
Admission: AD | Admit: 2016-01-04 | Discharge: 2016-01-06 | DRG: 775 | Disposition: A | Discharge status: Home / Self Care | Payer: Medicaid Other | Source: Ambulatory Visit | Attending: Family Medicine | Admitting: Family Medicine

## 2016-01-04 DIAGNOSIS — Z3483 Encounter for supervision of other normal pregnancy, third trimester: Secondary | ICD-10-CM | POA: Diagnosis present

## 2016-01-04 DIAGNOSIS — IMO0001 Reserved for inherently not codable concepts without codable children: Secondary | ICD-10-CM

## 2016-01-04 DIAGNOSIS — J45909 Unspecified asthma, uncomplicated: Secondary | ICD-10-CM | POA: Diagnosis present

## 2016-01-04 DIAGNOSIS — Z3A37 37 weeks gestation of pregnancy: Secondary | ICD-10-CM

## 2016-01-04 DIAGNOSIS — O9952 Diseases of the respiratory system complicating childbirth: Secondary | ICD-10-CM | POA: Diagnosis present

## 2016-01-04 HISTORY — DX: Headache, unspecified: R51.9

## 2016-01-04 HISTORY — DX: Headache: R51

## 2016-01-04 HISTORY — DX: Unspecified asthma, uncomplicated: J45.909

## 2016-01-04 LAB — CBC
HCT: 32.7 % — ABNORMAL LOW (ref 36.0–46.0)
HEMOGLOBIN: 10.6 g/dL — AB (ref 12.0–15.0)
MCH: 25.9 pg — AB (ref 26.0–34.0)
MCHC: 32.4 g/dL (ref 30.0–36.0)
MCV: 79.8 fL (ref 78.0–100.0)
Platelets: 260 10*3/uL (ref 150–400)
RBC: 4.1 MIL/uL (ref 3.87–5.11)
RDW: 14.9 % (ref 11.5–15.5)
WBC: 9.2 10*3/uL (ref 4.0–10.5)

## 2016-01-04 LAB — TYPE AND SCREEN
ABO/RH(D): O POS
Antibody Screen: NEGATIVE

## 2016-01-04 LAB — ABO/RH: ABO/RH(D): O POS

## 2016-01-04 MED ORDER — OXYCODONE-ACETAMINOPHEN 5-325 MG PO TABS: 2.0000 | ORAL_TABLET | ORAL | Status: DC | PRN

## 2016-01-04 MED ORDER — OXYTOCIN 40 UNITS IN LACTATED RINGERS INFUSION - SIMPLE MED
2.5000 [IU]/h | INTRAVENOUS | Status: DC
  Filled 2016-01-04: qty 1000

## 2016-01-04 MED ORDER — OXYTOCIN 40 UNITS IN LACTATED RINGERS INFUSION - SIMPLE MED: 1.0000 m[IU]/min | INTRAVENOUS | Status: DC

## 2016-01-04 MED ORDER — OXYCODONE-ACETAMINOPHEN 5-325 MG PO TABS: 1.0000 | ORAL_TABLET | ORAL | Status: DC | PRN

## 2016-01-04 MED ORDER — BENZOCAINE-MENTHOL 20-0.5 % EX AERO
1.0000 "application " | INHALATION_SPRAY | CUTANEOUS | Status: DC | PRN
  Administered 2016-01-05: 1 via TOPICAL
  Filled 2016-01-04: qty 56

## 2016-01-04 MED ORDER — ACETAMINOPHEN 325 MG PO TABS: 650.0000 mg | ORAL_TABLET | ORAL | Status: DC | PRN

## 2016-01-04 MED ORDER — COCONUT OIL OIL: 1.0000 "application " | TOPICAL_OIL | Status: DC | PRN

## 2016-01-04 MED ORDER — LACTATED RINGERS IV SOLN
INTRAVENOUS | Status: DC
  Administered 2016-01-04 (×2): via INTRAVENOUS

## 2016-01-04 MED ORDER — ALBUTEROL SULFATE (2.5 MG/3ML) 0.083% IN NEBU: 3.0000 mL | INHALATION_SOLUTION | Freq: Four times a day (QID) | RESPIRATORY_TRACT | Status: DC | PRN

## 2016-01-04 MED ORDER — ONDANSETRON HCL 4 MG/2ML IJ SOLN
4.0000 mg | INTRAMUSCULAR | Status: DC | PRN
Start: 2016-01-04 — End: 2016-01-06

## 2016-01-04 MED ORDER — SIMETHICONE 80 MG PO CHEW: 80.0000 mg | CHEWABLE_TABLET | ORAL | Status: DC | PRN

## 2016-01-04 MED ORDER — LIDOCAINE HCL (PF) 1 % IJ SOLN
30.0000 mL | INTRAMUSCULAR | Status: DC | PRN
  Filled 2016-01-04: qty 30

## 2016-01-04 MED ORDER — DIPHENHYDRAMINE HCL 25 MG PO CAPS: 25.0000 mg | ORAL_CAPSULE | Freq: Four times a day (QID) | ORAL | Status: DC | PRN

## 2016-01-04 MED ORDER — SOD CITRATE-CITRIC ACID 500-334 MG/5ML PO SOLN: 30.0000 mL | ORAL | Status: DC | PRN

## 2016-01-04 MED ORDER — TERBUTALINE SULFATE 1 MG/ML IJ SOLN
0.2500 mg | Freq: Once | INTRAMUSCULAR | Status: DC | PRN
  Filled 2016-01-04: qty 1

## 2016-01-04 MED ORDER — FENTANYL 2.5 MCG/ML BUPIVACAINE 1/10 % EPIDURAL INFUSION (WH - ANES)
14.0000 mL/h | INTRAMUSCULAR | Status: DC | PRN
  Administered 2016-01-04 (×3): 14 mL/h via EPIDURAL
  Filled 2016-01-04 (×2): qty 125

## 2016-01-04 MED ORDER — PHENYLEPHRINE 40 MCG/ML (10ML) SYRINGE FOR IV PUSH (FOR BLOOD PRESSURE SUPPORT)
80.0000 ug | PREFILLED_SYRINGE | INTRAVENOUS | Status: DC | PRN
  Filled 2016-01-04: qty 5

## 2016-01-04 MED ORDER — WITCH HAZEL-GLYCERIN EX PADS: 1.0000 "application " | MEDICATED_PAD | CUTANEOUS | Status: DC | PRN

## 2016-01-04 MED ORDER — DIBUCAINE 1 % RE OINT: 1.0000 "application " | TOPICAL_OINTMENT | RECTAL | Status: DC | PRN

## 2016-01-04 MED ORDER — ONDANSETRON HCL 4 MG PO TABS: 4.0000 mg | ORAL_TABLET | ORAL | Status: DC | PRN

## 2016-01-04 MED ORDER — LACTATED RINGERS IV SOLN: 500.0000 mL | INTRAVENOUS | Status: DC | PRN

## 2016-01-04 MED ORDER — TETANUS-DIPHTH-ACELL PERTUSSIS 5-2.5-18.5 LF-MCG/0.5 IM SUSP: 0.5000 mL | Freq: Once | INTRAMUSCULAR | Status: DC

## 2016-01-04 MED ORDER — IBUPROFEN 600 MG PO TABS
600.0000 mg | ORAL_TABLET | Freq: Four times a day (QID) | ORAL | Status: DC
  Administered 2016-01-05 – 2016-01-06 (×6): 600 mg via ORAL
  Filled 2016-01-04 (×6): qty 1

## 2016-01-04 MED ORDER — EPHEDRINE 5 MG/ML INJ
10.0000 mg | INTRAVENOUS | Status: DC | PRN
  Filled 2016-01-04: qty 4

## 2016-01-04 MED ORDER — OXYTOCIN BOLUS FROM INFUSION
500.0000 mL | Freq: Once | INTRAVENOUS | Status: AC
  Administered 2016-01-04: 500 mL via INTRAVENOUS

## 2016-01-04 MED ORDER — DIPHENHYDRAMINE HCL 50 MG/ML IJ SOLN: 12.5000 mg | INTRAMUSCULAR | Status: DC | PRN

## 2016-01-04 MED ORDER — ZOLPIDEM TARTRATE 5 MG PO TABS: 5.0000 mg | ORAL_TABLET | Freq: Every evening | ORAL | Status: DC | PRN

## 2016-01-04 MED ORDER — PRENATAL MULTIVITAMIN CH
1.0000 | ORAL_TABLET | Freq: Every day | ORAL | Status: DC
  Administered 2016-01-05 – 2016-01-06 (×2): 1 via ORAL
  Filled 2016-01-04 (×2): qty 1

## 2016-01-04 MED ORDER — ONDANSETRON HCL 4 MG/2ML IJ SOLN: 4.0000 mg | Freq: Four times a day (QID) | INTRAMUSCULAR | Status: DC | PRN

## 2016-01-04 MED ORDER — LACTATED RINGERS IV SOLN: 500.0000 mL | Freq: Once | INTRAVENOUS | Status: DC

## 2016-01-04 MED ORDER — SENNOSIDES-DOCUSATE SODIUM 8.6-50 MG PO TABS
2.0000 | ORAL_TABLET | ORAL | Status: DC
  Administered 2016-01-05: 2 via ORAL
  Filled 2016-01-04: qty 2

## 2016-01-04 MED ORDER — LACTATED RINGERS IV SOLN
500.0000 mL | Freq: Once | INTRAVENOUS | Status: AC
  Administered 2016-01-04: 500 mL via INTRAVENOUS

## 2016-01-04 MED ORDER — PHENYLEPHRINE 40 MCG/ML (10ML) SYRINGE FOR IV PUSH (FOR BLOOD PRESSURE SUPPORT)
80.0000 ug | PREFILLED_SYRINGE | INTRAVENOUS | Status: DC | PRN
  Filled 2016-01-04: qty 5
  Filled 2016-01-04: qty 10

## 2016-01-04 MED ORDER — OXYTOCIN 40 UNITS IN LACTATED RINGERS INFUSION - SIMPLE MED
1.0000 m[IU]/min | INTRAVENOUS | Status: DC
  Administered 2016-01-04: 2 m[IU]/min via INTRAVENOUS

## 2016-01-04 MED ORDER — LIDOCAINE HCL (PF) 1 % IJ SOLN
INTRAMUSCULAR | Status: DC | PRN
  Administered 2016-01-04 (×2): 3 mL

## 2016-01-04 NOTE — Progress Notes (Signed)
   Joan Francis is a 25 y.o. G3P1011 at [redacted]w[redacted]d  admitted for active labor  Subjective: Wanting to push now  Objective: Vitals:   01/04/16 1900 01/04/16 1930 01/04/16 2025 01/04/16 2030  BP: 131/68 (!) 111/49 128/60 (!) 123/53  Pulse: 83 93 87 79  Resp:  18 18 18   Temp:    97.8 F (36.6 C)  TempSrc:    Axillary  SpO2:      Weight:      Height:       Total I/O In: -  Out: 10 [Urine:10]  FHT:  FHR: 160 bpm, variability: moderate,  accelerations:  Present,  decelerations:  Present intermittent variable and late decels.  Always back to baseline UC:   irregular, every 2-5 minutes SVE:   Dilation: 10 Effacement (%): 100 Station: +1 Exam by:: sowder Pitocin @ 4 mu/min  Labs: Lab Results  Component Value Date   WBC 9.2 01/04/2016   HGB 10.6 (L) 01/04/2016   HCT 32.7 (L) 01/04/2016   MCV 79.8 01/04/2016   PLT 260 01/04/2016    Assessment / Plan: 2nd stage labor; Pt pushed for an hour w/o descent. Baby OP.  Sr Stinson manually rotated vtx, but it would turn back to OP soon after. Has now labored down w/peanut for about an hour and has a strong urge to push.  Will start pushing.  Labor: n   2nd stage Fetal Wellbeing:  Category I and Category II Pain Control:  Epidural Anticipated MOD:  NSVD  CRESENZO-DISHMAN,Rhetta Cleek 01/04/2016, 9:26 PM

## 2016-01-04 NOTE — MAU Note (Signed)
Contractions since 0230 

## 2016-01-04 NOTE — H&P (Signed)
LABOR ADMISSION HISTORY AND PHYSICAL  Joan Francis is a 25 y.o. female G3P1011 with IUP at [redacted]w[redacted]d by LMP, confirmed by [place] [redacted]w[redacted]d presenting for active labor. She reports +FM, + contractions that began last night, No LOF, no VB, no blurry vision, headaches or peripheral edema, and RUQ pain.  She plans on breastfeeding. She request IUD for birth control.  Sono:    @[redacted]w[redacted]d , CWD, normal anatomy, cephalic presentation, 1490g, 83% EFW   Prenatal History/Complications:  Past Medical History: Past Medical History:  Diagnosis Date  . Asthma   . Headache     Past Surgical History: Past Surgical History:  Procedure Laterality Date  . NO PAST SURGERIES      Obstetrical History: OB History    Gravida Para Term Preterm AB Living   3 1 1   1 1    SAB TAB Ectopic Multiple Live Births           1      Social History: Social History   Social History  . Marital status: Single    Spouse name: N/A  . Number of children: N/A  . Years of education: N/A   Social History Main Topics  . Smoking status: Never Smoker  . Smokeless tobacco: Never Used  . Alcohol use No  . Drug use: No  . Sexual activity: Yes    Birth control/ protection: None   Other Topics Concern  . None   Social History Narrative  . None    Family History: History reviewed. No pertinent family history.  Allergies: No Known Allergies  Prescriptions Prior to Admission  Medication Sig Dispense Refill Last Dose  . Prenatal MV & Min w/FA-DHA (PRENATAL ADULT GUMMY/DHA/FA PO) Take 2 each by mouth daily.   Past Week at Unknown time  . albuterol (PROVENTIL HFA;VENTOLIN HFA) 108 (90 Base) MCG/ACT inhaler Inhale 2 puffs into the lungs every 6 (six) hours as needed for wheezing or shortness of breath. 1 Inhaler 2 rescue  . omeprazole (PRILOSEC) 20 MG capsule Take 1 capsule (20 mg total) by mouth 2 (two) times daily before a meal. (Patient not taking: Reported on 01/04/2016) 60 capsule 5 Not Taking at Unknown time      Review of Systems   All systems reviewed and negative except as stated in HPI  BP (!) 116/55   Pulse 90   Temp 97.8 F (36.6 C) (Oral)   Resp 16   Ht 5\' 3"  (1.6 m)   Wt 91.3 kg (201 lb 3 oz)   LMP 04/16/2015   SpO2 100%   BMI 35.64 kg/m  General appearance: alert, cooperative and mild distress Lungs: clear to auscultation bilaterally Heart: regular rate and rhythm Abdomen: gravid Extremities: no sign of DVT, edema Fetal monitoringBaseline: 135 bpm, Variability: Good {> 6 bpm), Accelerations: Reactive and Decelerations: Absent Uterine activityDate/time of onset: last night; 4-7 min apart Dilation: 4 Effacement (%): 70 Station: -2 Exam by:: Dellie Burns, RN  BSN   Prenatal labs: ABO, Rh: O/Positive/-- (05/02 1513) Antibody: Negative (05/02 1513) Rubella: !Error! RPR: Non Reactive (05/30 1040)  HBsAg: Negative (05/02 1513)  HIV: Non Reactive (05/30 1040)  GBS: Negative (07/19 1059)  1 hr Glucola 107 Genetic screening: none Anatomy US: wnl  Prenatal Transfer Tool  Maternal Diabetes: No Genetic Screening: Declined Maternal Ultrasounds/Referrals: Normal Fetal Ultrasounds or other Referrals:  None Maternal Substance Abuse:  No Significant Maternal Medications:  None Significant Maternal Lab Results: Lab values include: Group B Strep negative  Results for orders  placed or performed during the hospital encounter of 01/04/16 (from the past 24 hour(s))  CBC   Collection Time: 01/04/16 11:50 AM  Result Value Ref Range   WBC 9.2 4.0 - 10.5 K/uL   RBC 4.10 3.87 - 5.11 MIL/uL   Hemoglobin 10.6 (L) 12.0 - 15.0 g/dL   HCT 16.1 (L) 09.6 - 04.5 %   MCV 79.8 78.0 - 100.0 fL   MCH 25.9 (L) 26.0 - 34.0 pg   MCHC 32.4 30.0 - 36.0 g/dL   RDW 40.9 81.1 - 91.4 %   Platelets 260 150 - 400 K/uL    Patient Active Problem List   Diagnosis Date Noted  . Active labor at term 01/04/2016  . Encounter for supervision of other normal pregnancy in third trimester 12/20/2015   . Encounter for supervision of other normal pregnancy in second trimester 10/03/2015  . Asthma affecting pregnancy, antepartum 10/03/2015    Assessment: Joan Francis is a 25 y.o. G3P1011 at [redacted]w[redacted]d here for active laboring.  #Labor: expectant  #Pain: Epidural placed #FWB: Norm baseline, + accels, no decels #ID:  GBS neg #MOF: breast #MOC:IUD #Circ:  outpatient  Jaci Lazier, Medical Student   I saw and examined this patient and made any corrections above.  Loni Muse, MD   OB FELLOW HISTORY AND PHYSICAL ATTESTATION  I have seen and examined this patient; I agree with above documentation in the resident's note.    Jen Mow, DO OB Fellow 01/04/2016, 11:57 PM

## 2016-01-04 NOTE — Discharge Instructions (Signed)
Braxton Hicks Contractions °Contractions of the uterus can occur throughout pregnancy. Contractions are not always a sign that you are in labor.  °WHAT ARE BRAXTON HICKS CONTRACTIONS?  °Contractions that occur before labor are called Braxton Hicks contractions, or false labor. Toward the end of pregnancy (32-34 weeks), these contractions can develop more often and may become more forceful. This is not true labor because these contractions do not result in opening (dilatation) and thinning of the cervix. They are sometimes difficult to tell apart from true labor because these contractions can be forceful and people have different pain tolerances. You should not feel embarrassed if you go to the hospital with false labor. Sometimes, the only way to tell if you are in true labor is for your health care provider to look for changes in the cervix. °If there are no prenatal problems or other health problems associated with the pregnancy, it is completely safe to be sent home with false labor and await the onset of true labor. °HOW CAN YOU TELL THE DIFFERENCE BETWEEN TRUE AND FALSE LABOR? °False Labor °· The contractions of false labor are usually shorter and not as hard as those of true labor.   °· The contractions are usually irregular.   °· The contractions are often felt in the front of the lower abdomen and in the groin.   °· The contractions may go away when you walk around or change positions while lying down.   °· The contractions get weaker and are shorter lasting as time goes on.   °· The contractions do not usually become progressively stronger, regular, and closer together as with true labor.   °True Labor °· Contractions in true labor last 30-70 seconds, become very regular, usually become more intense, and increase in frequency.   °· The contractions do not go away with walking.   °· The discomfort is usually felt in the top of the uterus and spreads to the lower abdomen and low back.   °· True labor can be  determined by your health care provider with an exam. This will show that the cervix is dilating and getting thinner.   °WHAT TO REMEMBER °· Keep up with your usual exercises and follow other instructions given by your health care provider.   °· Take medicines as directed by your health care provider.   °· Keep your regular prenatal appointments.   °· Eat and drink lightly if you think you are going into labor.   °· If Braxton Hicks contractions are making you uncomfortable:   °¨ Change your position from lying down or resting to walking, or from walking to resting.   °¨ Sit and rest in a tub of warm water.   °¨ Drink 2-3 glasses of water. Dehydration may cause these contractions.   °¨ Do slow and deep breathing several times an hour.   °WHEN SHOULD I SEEK IMMEDIATE MEDICAL CARE? °Seek immediate medical care if: °· Your contractions become stronger, more regular, and closer together.   °· You have fluid leaking or gushing from your vagina.   °· You have a fever.   °· You pass blood-tinged mucus.   °· You have vaginal bleeding.   °· You have continuous abdominal pain.   °· You have low back pain that you never had before.   °· You feel your baby's head pushing down and causing pelvic pressure.   °· Your baby is not moving as much as it used to.   °  °This information is not intended to replace advice given to you by your health care provider. Make sure you discuss any questions you have with your health care   provider. °  °Document Released: 05/20/2005 Document Revised: 05/25/2013 Document Reviewed: 03/01/2013 °Elsevier Interactive Patient Education ©2016 [employer] ° °

## 2016-01-04 NOTE — Anesthesia Pain Management Evaluation Note (Signed)
  CRNA Pain Management Visit Note  Patient: Joan Francis, 25 y.o., female  "Hello I am a member of the anesthesia team at [facility]. We have an anesthesia team available at all times to provide care throughout the hospital, including epidural management and anesthesia for C-section. I don't know your plan for the delivery whether it a natural birth, water birth, IV sedation, nitrous supplementation, doula or epidural, but we want to meet your pain goals."   1.Was your pain managed to your expectations on prior hospitalizations?     2.What is your expectation for pain management during this hospitalization?   3.How can we help you reach that goal?   Record the patient's initial score and the patient's pain goal.   Pain: 0  Pain Goal: 6 [facility] wants you to be able to say your pain was always managed very well.  Laban Emperor 01/04/2016

## 2016-01-04 NOTE — MAU Note (Signed)
Patient presents at [redacted] weeks gestation with c/o regular strong contractions since 0600. Fetus active. Denies bleeding or discharge.

## 2016-01-04 NOTE — Anesthesia Preprocedure Evaluation (Signed)
Anesthesia Evaluation  Patient identified by MRN, date of birth, ID band Patient awake    Reviewed: Allergy & Precautions, NPO status , Patient's Chart, lab work & pertinent test results  Airway Mallampati: II  TM Distance: >3 FB Neck ROM: Full    Dental no notable dental hx.    Pulmonary asthma ,    Pulmonary exam normal breath sounds clear to auscultation       Cardiovascular negative cardio ROS Normal cardiovascular exam Rhythm:Regular Rate:Normal     Neuro/Psych  Headaches, negative psych ROS   GI/Hepatic negative GI ROS, Neg liver ROS,   Endo/Other  negative endocrine ROS  Renal/GU negative Renal ROS  negative genitourinary   Musculoskeletal negative musculoskeletal ROS (+)   Abdominal   Peds negative pediatric ROS (+)  Hematology negative hematology ROS (+)   Anesthesia Other Findings   Reproductive/Obstetrics negative OB ROS                             Anesthesia Physical Anesthesia Plan  ASA: II  Anesthesia Plan: Epidural   Post-op Pain Management:    Induction: Intravenous  Airway Management Planned: Natural Airway  Additional Equipment:   Intra-op Plan:   Post-operative Plan:   Informed Consent: I have reviewed the patients History and Physical, chart, labs and discussed the procedure including the risks, benefits and alternatives for the proposed anesthesia with the patient or authorized representative who has indicated his/her understanding and acceptance.   Dental advisory given  Plan Discussed with: CRNA  Anesthesia Plan Comments: (Informed consent obtained prior to proceeding including risk of failure, 1% risk of PDPH, risk of minor discomfort and bruising.  Discussed rare but serious complications including epidural abscess, permanent nerve injury, epidural hematoma.  Discussed alternatives to epidural analgesia and patient desires to proceed.  Timeout  performed pre-procedure verifying patient name, procedure, and platelet count.  Patient tolerated procedure well.)        Anesthesia Quick Evaluation

## 2016-01-04 NOTE — Anesthesia Procedure Notes (Signed)
Epidural Patient location during procedure: OB  Staffing Anesthesiologist: Alayiah Fontes  Preanesthetic Checklist Completed: patient identified, site marked, surgical consent, pre-op evaluation, timeout performed, IV checked, risks and benefits discussed and monitors and equipment checked  Epidural Patient position: sitting Prep: DuraPrep Patient monitoring: blood pressure and heart rate Approach: midline Location: L4-L5 Injection technique: LOR saline  Needle:  Needle type: Tuohy  Needle gauge: 17 G Needle length: 9 cm Needle insertion depth: 6 cm Catheter type: closed end flexible Catheter size: 19 Gauge Catheter at skin depth: 14 cm Test dose: negative and Other  Assessment Events: blood not aspirated, injection not painful, no injection resistance, negative IV test and no paresthesia  Additional Notes Reason for block:procedure for pain     

## 2016-01-05 ENCOUNTER — Encounter (HOSPITAL_COMMUNITY): Payer: Self-pay

## 2016-01-05 LAB — RPR: RPR Ser Ql: NONREACTIVE

## 2016-01-05 MED ORDER — FAMOTIDINE 20 MG PO TABS
20.0000 mg | ORAL_TABLET | Freq: Every day | ORAL | Status: DC
  Filled 2016-01-05 (×2): qty 1

## 2016-01-05 NOTE — Progress Notes (Signed)
Patient denies any headaches blurred vision or dizzyness at this time. Patient alert and oriented.

## 2016-01-05 NOTE — Progress Notes (Signed)
Patient denies any headaches blurred vision or dizzyness at this time. Patient alert and oriented. 

## 2016-01-05 NOTE — Progress Notes (Signed)
Patient called out to use the bathroom. Ambulated out of bed with minimal to no assistance. Patient voided an adequate amount of urine, peri care given. Ambulated back into bed with no assistance.

## 2016-01-05 NOTE — Progress Notes (Signed)
NO HEADACHES NOTED.

## 2016-01-05 NOTE — Progress Notes (Signed)
Patient transferred to my care from labor and delivery into room 110. While attempting to move wheelchair closer to the patients bed, she attempted to stand and almost fell. Both the L&D nurse and I assisted her into bed. Oriented to room, call bell within reach. Patient states she will call if she needs to go to the bathroom for assistance.

## 2016-01-05 NOTE — Anesthesia Postprocedure Evaluation (Signed)
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  Procedure(s) Performed: * No procedures listed *  Patient location during evaluation: Mother Baby Anesthesia Type: Epidural Level of consciousness: awake and alert Pain management: pain level controlled Vital Signs Assessment: post-procedure vital signs reviewed and stable Respiratory status: spontaneous breathing, nonlabored ventilation and respiratory function stable Cardiovascular status: stable Postop Assessment: no headache, no backache and epidural receding Anesthetic complications: no     Last Vitals:  Vitals:   01/05/16 0250 01/05/16 0650  BP: 118/61 112/68  Pulse: 82 79  Resp: 18 18  Temp: 36.8 C 36.7 C    Last Pain:  Vitals:   01/05/16 0650  TempSrc: Oral  PainSc: 0-No pain   Pain Goal: Patients Stated Pain Goal: 0 (01/04/16 1125)               Junious Silk

## 2016-01-05 NOTE — Lactation Note (Signed)
This note was copied from a baby's chart. Lactation Consultation Note  Patient Name: Joan Francis [id number] Date: 01/05/2016 -  Reason for consult: Initial assessment;Other (Comment) (early term infant )  Baby is 16 hours old and has been to the breast several times per mom and doc flow sheets.  @ this consult baby acting hungry and rooting. LC assisted baby on the right breast in football position. Prior to latch - showed mom how to hand express and steady flow of colostrum noted.  Baby opened wide and latched with depth , multiply swallows, increased with breast compressions.  Baby fed 10 mins , and during the 10 mins , seemed intermittent fussy even though baby latched well.  Mom switched the baby to the other breast in modified laid back position and mom was able to obtain a deep  Latch for 7 mins , and swallows. Baby has stooled in life x2 , may be gasey. LC reviewed basics - importance of skin to skin feedings until the baby can stay awake for feeding,  Also described nutritive vs non - nutritive feeding patterns to mom.  And reviewed hand expressing, potential feeding behaviors with a Early term infant.  Mother informed of post-discharge support and given phone number to the lactation department, including services  for phone call assistance; out-patient appointments; and breastfeeding support group. List of other breastfeeding resources  in the community given in the handout. Encouraged mother to call for problems or concerns related to breastfeeding. Per mom breast fed 1st baby 2 years, but he never latched in the hospital, it was when he went home.  Per mom has been active with [facility] and attended the Breast feeding class.     Maternal Data Has patient been taught Hand Expression?: Yes  Feeding Feeding Type: Breast Fed Length of feed: 7 min (multiply swallows noted )  LATCH Score/Interventions Latch: Repeated attempts needed to sustain latch, nipple held in mouth throughout  feeding, stimulation needed to elicit sucking reflex. Intervention(s): Adjust position;Assist with latch;Breast massage;Breast compression  Audible Swallowing: Spontaneous and intermittent Intervention(s): Hand expression  Type of Nipple: Everted at rest and after stimulation  Comfort (Breast/Nipple): Soft / non-tender     Hold (Positioning): No assistance needed to correctly position infant at breast. Intervention(s): Breastfeeding basics reviewed;Support Pillows;Position options;Skin to skin  LATCH Score: 9  Lactation Tools Discussed/Used WIC Program: Yes (per mom - [place] )   Consult Status Consult Status: Follow-up Date: 01/06/16 Follow-up type: In-patient    [name] 01/05/2016, 5:55 PM

## 2016-01-05 NOTE — Progress Notes (Signed)
Post Partum Day 1  Subjective:  Joan Francis is a 25 y.o. [id number] [redacted]w[redacted]d s/p SVD.  She fell last night as she was coming off her epidural, but now fine.  Pt denies problems with ambulating, voiding or po intake.  She denies nausea or vomiting.  Pain is well controlled.  She has not had flatus. She has not had bowel movement.  Lochia Small.  Plan for birth control is IUD.  Method of Feeding: breast  Objective: BP 112/68 (BP Location: Right Arm)   Pulse 79   Temp 98.1 F (36.7 C) (Oral)   Resp 18   Ht 5\' 3"  (1.6 m)   Wt 91.3 kg (201 lb 3 oz)   LMP 04/16/2015   SpO2 100%   Breastfeeding? Unknown   BMI 35.64 kg/m   Physical Exam:  General: alert, cooperative and no distress Lochia:normal flow Chest: CTAB Heart: RRR no m/r/g Abdomen: +BS, soft, nontender, fundus firm at/below umbilicus Uterine Fundus: firm DVT Evaluation: No evidence of DVT seen on physical exam. Extremities: no edema   Recent Labs  01/04/16 1150  HGB 10.6*  HCT 32.7*    Assessment/Plan:  ASSESSMENT: Joan Francis is a 25 y.o. [id number] [redacted]w[redacted]d ppd #1 s/p NSVD doing well.   Plan for discharge tomorrow and Discharge home   LOS: 1 day   Jaci Lazier 01/05/2016, 7:19 AM   CNM attestation Post Partum Day #1  Joan Francis is a 25 y.o. [id number] s/p SVD.  Pt denies problems with ambulating, voiding or po intake. Pain is well controlled.  Plan for birth control is IUD.  Method of Feeding: breast  PE:  BP 112/68 (BP Location: Right Arm)   Pulse 79   Temp 98.1 F (36.7 C) (Oral)   Resp 18   Ht 5\' 3"  (1.6 m)   Wt 91.3 kg (201 lb 3 oz)   LMP 04/16/2015   SpO2 100%   Breastfeeding? Unknown   BMI 35.64 kg/m  Fundus firm  Plan for discharge: 01/06/16  Cam Hai, CNM 8:13 AM  01/05/2016

## 2016-01-05 NOTE — Significant Event (Signed)
Patient called out at 0035 stating she needed to go to the bathroom and would like pain medication. After removing her medication from the pyxis, I entered her room at 0038 to find her walking into the bathroom with the assistance of her mother. She states "I fell, I had to pee and couldn't wait". I asked her if she felt okay and she said "yes I am fine". I assisted her to the toilet. While sitting, I retrieved a stedy to help her back to bed. She refused the stedy stating "I dont need that". We agreed to walk back to bed with my assistance. Her gait appeared to be steady. Her vitals are stable, assessment is within normal limits. She states she only fell to her knee but it does not hurt. We discussed the importance of waiting for me to help her get out of bed. She states "it was my fault, I was rushing to go to the bathroom". She understands to call the next time she has to void.

## 2016-01-05 NOTE — Progress Notes (Signed)
Patient fell last night. She stated she is ok today and can walk on her own.

## 2016-01-06 MED ORDER — ACETAMINOPHEN 325 MG PO TABS: 650.0000 mg | ORAL_TABLET | ORAL | 1 refills | Status: AC | PRN

## 2016-01-06 MED ORDER — IBUPROFEN 600 MG PO TABS: 600.0000 mg | ORAL_TABLET | Freq: Four times a day (QID) | ORAL | 0 refills | Status: AC

## 2016-01-06 NOTE — Lactation Note (Addendum)
This note was copied from a baby's chart. Lactation Consultation Note Experienced BF mom, BF on side of bed hunched over to baby w/o support when LC entered rm. Mom states BF hurts. Assessed moms latch. Mom has everted very compressible nipples. Hand expressed colostrum. Baby doesn't open very wide and bites or clamps down. Can protrude tongue past gum line, when suckling on gloved finger, doesn't have good peristolic motion of tongue at times when clamping. Mom states baby can get a good deep latch but then push nipple out some w/tongue. Fitted #20 NS d/t painful latching and biting. Mom stated much better. Explained since mom has great everted nipples, NS was temporary until baby learned to suck properly. Try to BF w/o NS several times a day. Mom has WIC, also has LC OP # for appt. For NS if cont. To wear it for feedings. encouraged to call for assistance and invited to support groups. Mom states she proably will not use NS. Ave hand pump w/demonstrated of application and care to post pump to stimulate milk supply.  Patient Name: Joan Francis Date: 01/06/2016 Reason for consult: Follow-up assessment   Maternal Data    Feeding Feeding Type: Breast Fed Length of feed: 5 min  LATCH Score/Interventions Latch: Repeated attempts needed to sustain latch, nipple held in mouth throughout feeding, stimulation needed to elicit sucking reflex. Intervention(s): Adjust position;Assist with latch;Breast massage;Breast compression  Audible Swallowing: Spontaneous and intermittent Intervention(s): Skin to skin;Hand expression;Alternate breast massage  Type of Nipple: Everted at rest and after stimulation  Comfort (Breast/Nipple): Filling, red/small blisters or bruises, mild/mod discomfort  Problem noted: Mild/Moderate discomfort  Hold (Positioning): Assistance needed to correctly position infant at breast and maintain latch. Intervention(s): Breastfeeding basics reviewed;Support  Pillows;Position options;Skin to skin  LATCH Score: 7  Lactation Tools Discussed/Used Tools: Pump;Nipple Shields Nipple shield size: 20 Breast pump type: Manual WIC Program: Yes Pump Review: Setup, frequency, and cleaning;Milk Storage Initiated by:: Peri Jefferson RN IBCLC Date initiated:: 01/06/16   Consult Status Consult Status: Complete Date: 01/06/16    Charyl Dancer 01/06/2016, 10:50 AM

## 2016-01-06 NOTE — Discharge Summary (Signed)
Obstetric Discharge Summary Reason for Admission: onset of labor Prenatal Procedures: ultrasound Intrapartum Procedures: spontaneous vaginal delivery Postpartum Procedures: none Complications-Operative and Postpartum: none Hemoglobin  Date Value Ref Range Status  01/04/2016 10.6 (L) 12.0 - 15.0 g/dL Final   HCT  Date Value Ref Range Status  01/04/2016 32.7 (L) 36.0 - 46.0 % Final   Hematocrit  Date Value Ref Range Status  10/31/2015 33.1 (L) 34.0 - 46.6 % Final    Physical Exam:  General: alert, cooperative and no distress Lochia: appropriate Uterine Fundus: firm Incision: NA DVT Evaluation: No evidence of DVT seen on physical exam.  Discharge Diagnoses: Term Pregnancy-delivered  Discharge Information: Date: 01/06/2016 Activity: pelvic rest Diet: routine Medications: Acetaminophen & ibuprofen for pain, prenatal vitamin Condition: stable Instructions: refer to practice specific booklet Discharge to: home Follow-up Information    [facility] Call in 6 week(s).   Specialty:  Obstetrics and Gynecology Contact information: [address], [address] [place] [address] [phone]          Newborn Data: Live born female  Birth Weight: 7 lb 0.9 oz (3201 g) APGAR: 8, 9  Home with mother.  Andres Ege, MD, PGY-1, MPH 01/06/2016, 8:06 AM   I was present for the exam and agree with above.  [place], CNM 01/06/2016 11:09 AM

## 2016-01-09 ENCOUNTER — Encounter: Payer: Self-pay | Admitting: Certified Nurse Midwife

## 2016-01-09 ENCOUNTER — Ambulatory Visit (INDEPENDENT_AMBULATORY_CARE_PROVIDER_SITE_OTHER): Payer: Medicaid Other | Admitting: Certified Nurse Midwife

## 2016-01-09 DIAGNOSIS — O9212 Cracked nipple associated with the puerperium: Secondary | ICD-10-CM

## 2016-01-09 NOTE — Progress Notes (Signed)
Subjective:     Joan Francis is a 25 y.o. female who presents for a postpartum visit. She is 5 days postpartum following a spontaneous vaginal delivery. I have fully reviewed the prenatal and intrapartum course. The delivery was at 37.4 gestational weeks. Outcome: spontaneous vaginal delivery. Anesthesia: epidural. Postpartum course has been complicated by difficulties with breast feeding: infant latch and nipple cracking. Baby's course has been normal. Baby is feeding by both breast and bottle - Similac Advance. Bleeding staining only. Bowel function is normal. Bladder function is normal. Patient is not sexually active. Contraception method is abstinence. Postpartum depression screening: negative.  Tobacco, alcohol and substance abuse history reviewed.  Adult immunizations reviewed including TDAP, rubella and varicella.  The following portions of the patient's history were reviewed and updated as appropriate: allergies, current medications, past family history, past medical history, past social history, past surgical history and problem list.  Review of Systems Pertinent items noted in HPI and remainder of comprehensive ROS otherwise negative.   Objective:    BP 122/76   Pulse 66   Wt 191 lb (86.6 kg)   LMP 04/16/2015   BMI 33.83 kg/m   General:  alert, cooperative and no distress   Breasts:  negative findings: normal in size and symmetry, normal contour with no evidence of flattening or dimpling, nipples everted without rashes or discharge, no palpable axillary lymphadenopathy and positive findings: bilateral nipple cracking  Lungs: clear to auscultation bilaterally  Heart:  regular rate and rhythm, S1, S2 normal, no murmur, click, rub or gallop  Abdomen: soft, non-tender; bowel sounds normal; no masses,  no organomegaly          50% of 15 min visit spent on counseling and coordination of care.  Assessment:     Normal 5 day postpartum exam. Pap smear not done at today's visit.    Contraception  Management     Breast feeding issues: difficulty with latch/cracked nipples Plan:    1. Contraception: abstinence 2.  [facility] completed 3. Follow up in: 3 weeks or as needed.   4. Planning Mirena IUD for contracepiton 2hr GTT for h/o GDM/screening for DM q 3 yrs per ADA recommendations Preconception counseling provided Healthy lifestyle practices reviewed

## 2016-01-10 ENCOUNTER — Telehealth: Payer: Self-pay | Admitting: *Deleted

## 2016-01-10 NOTE — Telephone Encounter (Signed)
Called patient to let her know that nipple cream will be ready for her at the [facility] on [address] location.

## 2016-01-12 ENCOUNTER — Encounter: Payer: Self-pay | Admitting: Certified Nurse Midwife

## 2016-02-01 ENCOUNTER — Ambulatory Visit: Payer: Self-pay | Admitting: Certified Nurse Midwife

## 2016-02-01 ENCOUNTER — Ambulatory Visit (INDEPENDENT_AMBULATORY_CARE_PROVIDER_SITE_OTHER): Payer: Medicaid Other | Admitting: Obstetrics and Gynecology

## 2016-02-01 DIAGNOSIS — Z3009 Encounter for other general counseling and advice on contraception: Secondary | ICD-10-CM

## 2016-02-01 NOTE — Progress Notes (Signed)
Subjective:     Joan Francis is a 25 y.o. female who presents for a postpartum visit. She is 3 weeks postpartum following a spontaneous vaginal delivery. I have fully reviewed the prenatal and intrapartum course. The delivery was at 37 gestational weeks. Outcome: spontaneous vaginal delivery. Anesthesia: epidural. Postpartum course has been uncomplicated. Baby's course has been uncomplicated. Baby is feeding by breast. Bleeding no bleeding. Bowel function is normal. Bladder function is normal. Patient is not sexually active. Contraception method is abstinence. Postpartum depression screening: negative.     Review of Systems Pertinent items are noted in HPI.   Objective:    BP 139/84   Pulse (!) 110   Wt 172 lb (78 kg)   LMP 04/16/2015   BMI 30.47 kg/m                                          Assessment:     Normal 3- week postpartum exam. Pap smear not done at today's visit.   Plan:    1. Contraception: IUD 2. Patient will return in 3 weeks for IUD insertion. She is deciding between Mirena vs Paraguard 3. Follow up in: 3 weeks or as needed.

## 2016-02-22 ENCOUNTER — Encounter: Payer: Self-pay | Admitting: Obstetrics and Gynecology

## 2016-02-22 ENCOUNTER — Ambulatory Visit (INDEPENDENT_AMBULATORY_CARE_PROVIDER_SITE_OTHER): Payer: Medicaid Other | Admitting: Obstetrics and Gynecology

## 2016-02-22 DIAGNOSIS — Z30430 Encounter for insertion of intrauterine contraceptive device: Secondary | ICD-10-CM

## 2016-02-22 DIAGNOSIS — Z30014 Encounter for initial prescription of intrauterine contraceptive device: Secondary | ICD-10-CM

## 2016-02-22 DIAGNOSIS — Z3202 Encounter for pregnancy test, result negative: Secondary | ICD-10-CM | POA: Diagnosis not present

## 2016-02-22 LAB — POCT URINE PREGNANCY: Preg Test, Ur: NEGATIVE

## 2016-02-22 NOTE — Progress Notes (Signed)
Subjective:     Joan Francis is a 25 y.o. female who presents for a postpartum visit. She is 7 weeks postpartum following a spontaneous vaginal delivery. I have fully reviewed the prenatal and intrapartum course. The delivery was at 37.4 gestational weeks. Outcome: spontaneous vaginal delivery. Anesthesia: epidural. Postpartum course has been uncomplicated. Baby's course has been uncomplicated. Baby is feeding by breast. Bleeding no bleeding. Bowel function is normal. Bladder function is normal. Patient is not sexually active. Contraception method is abstinence. Postpartum depression screening: negative. She is living with her mother and 2 children. She has a strong support system     Review of Systems Pertinent items are noted in HPI.   Objective:    There were no vitals taken for this visit.  General:  alert, cooperative and no distress   Breasts:  inspection negative, no nipple discharge or bleeding, no masses or nodularity palpable  Lungs: clear to auscultation bilaterally  Heart:  regular rate and rhythm  Abdomen: soft, non-tender; bowel sounds normal; no masses,  no organomegaly   Vulva:  normal  Vagina: normal vagina, no discharge, exudate, lesion, or erythema  Cervix:  multiparous appearance  Corpus: normal size, contour, position, consistency, mobility, non-tender  Adnexa:  normal adnexa and no mass, fullness, tenderness  Rectal Exam: Not performed.        Assessment:     Normal postpartum exam. Pap smear not done at today's visit.   Plan:    1. Contraception: IUD  IUD Procedure Note Patient identified, informed consent performed, signed copy in chart, time out was performed.  Urine pregnancy test negative.  Speculum placed in the vagina.  Cervix visualized.  Cleaned with Betadine x 2.  Grasped anteriorly with a single tooth tenaculum.  Uterus sounded to 8 cm.  Mirena IUD placed per manufacturer's recommendations.  Strings trimmed to 3 cm. Tenaculum was removed, good  hemostasis noted.  Patient tolerated procedure well.   Patient given post procedure instructions and Mirena care card with expiration date (lot [id number] Exp 2/20).  Patient is asked to check IUD strings periodically and follow up in 4-6 weeks for IUD check.   2. Patient is medically cleared to resume all activities of daily living 3. Follow up in: 4 weeks or as needed.

## 2016-02-22 NOTE — Patient Instructions (Signed)
Levonorgestrel intrauterine device (IUD) What is this medicine? LEVONORGESTREL IUD (LEE voe nor jes trel) is a contraceptive (birth control) device. The device is placed inside the uterus by a healthcare professional. It is used to prevent pregnancy and can also be used to treat heavy bleeding that occurs during your period. Depending on the device, it can be used for 3 to 5 years. This medicine may be used for other purposes; ask your health care provider or pharmacist if you have questions. What should I tell my health care provider before I take this medicine? They need to know if you have any of these conditions: -abnormal Pap smear -cancer of the breast, uterus, or cervix -diabetes -endometritis -genital or pelvic infection now or in the past -have more than one sexual partner or your partner has more than one partner -heart disease -history of an ectopic or tubal pregnancy -immune system problems -IUD in place -liver disease or tumor -problems with blood clots or take blood-thinners -use intravenous drugs -uterus of unusual shape -vaginal bleeding that has not been explained -an unusual or allergic reaction to levonorgestrel, other hormones, silicone, or polyethylene, medicines, foods, dyes, or preservatives -pregnant or trying to get pregnant -breast-feeding How should I use this medicine? This device is placed inside the uterus by a health care professional. Talk to your pediatrician regarding the use of this medicine in children. Special care may be needed. Overdosage: If you think you have taken too much of this medicine contact a poison control center or emergency room at once. NOTE: This medicine is only for you. Do not share this medicine with others. What if I miss a dose? This does not apply. What may interact with this medicine? Do not take this medicine with any of the following medications: -amprenavir -bosentan -fosamprenavir This medicine may also interact with  the following medications: -aprepitant -barbiturate medicines for inducing sleep or treating seizures -bexarotene -griseofulvin -medicines to treat seizures like carbamazepine, ethotoin, felbamate, oxcarbazepine, phenytoin, topiramate -modafinil -pioglitazone -rifabutin -rifampin -rifapentine -some medicines to treat HIV infection like atazanavir, indinavir, lopinavir, nelfinavir, tipranavir, ritonavir -St. John's wort -warfarin This list may not describe all possible interactions. Give your health care provider a list of all the medicines, herbs, non-prescription drugs, or dietary supplements you use. Also tell them if you smoke, drink alcohol, or use illegal drugs. Some items may interact with your medicine. What should I watch for while using this medicine? Visit your doctor or health care professional for regular check ups. See your doctor if you or your partner has sexual contact with others, becomes HIV positive, or gets a sexual transmitted disease. This product does not protect you against HIV infection (AIDS) or other sexually transmitted diseases. You can check the placement of the IUD yourself by reaching up to the top of your vagina with clean fingers to feel the threads. Do not pull on the threads. It is a good habit to check placement after each menstrual period. Call your doctor right away if you feel more of the IUD than just the threads or if you cannot feel the threads at all. The IUD may come out by itself. You may become pregnant if the device comes out. If you notice that the IUD has come out use a backup birth control method like condoms and call your health care provider. Using tampons will not change the position of the IUD and are okay to use during your period. What side effects may I notice from receiving this medicine?   Side effects that you should report to your doctor or health care professional as soon as possible: -allergic reactions like skin rash, itching or  hives, swelling of the face, lips, or tongue -fever, flu-like symptoms -genital sores -high blood pressure -no menstrual period for 6 weeks during use -pain, swelling, warmth in the leg -pelvic pain or tenderness -severe or sudden headache -signs of pregnancy -stomach cramping -sudden shortness of breath -trouble with balance, talking, or walking -unusual vaginal bleeding, discharge -yellowing of the eyes or skin Side effects that usually do not require medical attention (report to your doctor or health care professional if they continue or are bothersome): -acne -breast pain -change in sex drive or performance -changes in weight -cramping, dizziness, or faintness while the device is being inserted -headache -irregular menstrual bleeding within first 3 to 6 months of use -nausea This list may not describe all possible side effects. Call your doctor for medical advice about side effects. You may report side effects to FDA at 1-800-FDA-1088. Where should I keep my medicine? This does not apply. NOTE: This sheet is a summary. It may not cover all possible information. If you have questions about this medicine, talk to your doctor, pharmacist, or health care provider.    2016, Elsevier/Gold Standard. (2011-06-20 13:54:04)  

## 2016-03-21 ENCOUNTER — Ambulatory Visit: Payer: Self-pay | Admitting: Certified Nurse Midwife

## 2016-07-30 ENCOUNTER — Ambulatory Visit: Payer: Medicaid Other | Admitting: Obstetrics

## 2016-09-16 ENCOUNTER — Encounter: Payer: Self-pay | Admitting: Obstetrics

## 2016-09-16 ENCOUNTER — Other Ambulatory Visit (HOSPITAL_COMMUNITY)
Admission: RE | Admit: 2016-09-16 | Discharge: 2016-09-16 | Disposition: A | Discharge status: Home / Self Care | Payer: Managed Care, Other (non HMO) | Source: Ambulatory Visit | Attending: Obstetrics | Admitting: Obstetrics

## 2016-09-16 ENCOUNTER — Ambulatory Visit (INDEPENDENT_AMBULATORY_CARE_PROVIDER_SITE_OTHER): Payer: Managed Care, Other (non HMO) | Admitting: Obstetrics

## 2016-09-16 DIAGNOSIS — B9689 Other specified bacterial agents as the cause of diseases classified elsewhere: Secondary | ICD-10-CM | POA: Diagnosis not present

## 2016-09-16 DIAGNOSIS — N760 Acute vaginitis: Secondary | ICD-10-CM | POA: Insufficient documentation

## 2016-09-16 DIAGNOSIS — G44209 Tension-type headache, unspecified, not intractable: Secondary | ICD-10-CM

## 2016-09-16 DIAGNOSIS — Z01419 Encounter for gynecological examination (general) (routine) without abnormal findings: Secondary | ICD-10-CM | POA: Insufficient documentation

## 2016-09-16 DIAGNOSIS — R399 Unspecified symptoms and signs involving the genitourinary system: Secondary | ICD-10-CM

## 2016-09-16 DIAGNOSIS — Z124 Encounter for screening for malignant neoplasm of cervix: Secondary | ICD-10-CM

## 2016-09-16 NOTE — Progress Notes (Signed)
Subjective:        Joan Francis is a 26 y.o. female here for a routine exam.  Current complaints: Recurrent BV, UTI Symptoms, HA since IUD insertion..    Personal health questionnaire:  Is patient Ashkenazi Jewish, have a family history of breast and/or ovarian cancer: no Is there a family history of uterine cancer diagnosed at age < 3, gastrointestinal cancer, urinary tract cancer, family member who is a Personnel officer syndrome-associated carrier: no Is the patient overweight and hypertensive, family history of diabetes, personal history of gestational diabetes, preeclampsia or PCOS: no Is patient over 84, have PCOS,  family history of premature CHD under age 37, diabetes, smoke, have hypertension or peripheral artery disease:  no At any time, has a partner hit, kicked or otherwise hurt or frightened you?: no Over the past 2 weeks, have you felt down, depressed or hopeless?: no Over the past 2 weeks, have you felt little interest or pleasure in doing things?:no   Gynecologic History No LMP recorded. Contraception: IUD Last Pap: Unknown. Results were: normal Last mammogram: n/a. Results were: n/a  Obstetric History OB History  Gravida Para Term Preterm AB Living  SAB TAB Ectopic Multiple Live Births        0 2    # Outcome Date GA Lbr Len/2nd Weight Sex Delivery Anes PTL Lv  3 Term 01/04/16 [redacted]w[redacted]d 03:49 / 03:11 7 lb 0.9 oz (3.201 kg) M Vag-Spont EPI  LIV  2 Term 03/22/10 [redacted]w[redacted]d   M Vag-Spont   LIV  1 AB 01/01/09              Past Medical History:  Diagnosis Date  . Asthma   . Headache     Past Surgical History:  Procedure Laterality Date  . NO PAST SURGERIES       Current Outpatient Prescriptions:  .  acetaminophen (TYLENOL) 325 MG tablet, Take 2 tablets (650 mg total) by mouth every 4 (four) hours as needed (for pain scale < 4). (Patient not taking: Reported on 02/22/2016), Disp: 30 tablet, Rfl: 1 .  albuterol (PROVENTIL HFA;VENTOLIN HFA) 108 (90 Base)  MCG/ACT inhaler, Inhale 2 puffs into the lungs every 6 (six) hours as needed for wheezing or shortness of breath. (Patient not taking: Reported on 02/22/2016), Disp: 1 Inhaler, Rfl: 2 .  ibuprofen (ADVIL,MOTRIN) 600 MG tablet, Take 1 tablet (600 mg total) by mouth every 6 (six) hours. (Patient not taking: Reported on 02/22/2016), Disp: 30 tablet, Rfl: 0 .  Prenatal MV & Min w/FA-DHA (PRENATAL ADULT GUMMY/DHA/FA PO), Take 2 each by mouth daily., Disp: , Rfl:  No Known Allergies  Social History  Substance Use Topics  . Smoking status: Never Smoker  . Smokeless tobacco: Never Used  . Alcohol use No    History reviewed. No pertinent family history.    Review of Systems  Constitutional: negative for fatigue and weight loss Respiratory: negative for cough and wheezing Cardiovascular: negative for chest pain, fatigue and palpitations Gastrointestinal: negative for abdominal pain and change in bowel habits Musculoskeletal:negative for myalgias Neurological: negative for gait problems and tremors Behavioral/Psych: negative for abusive relationship, depression Endocrine: negative for temperature intolerance    Genitourinary:negative for abnormal menstrual periods, genital lesions, hot flashes, sexual problems and vaginal discharge Integument/breast: negative for breast lump, breast tenderness, nipple discharge and skin lesion(s)    Objective:       There were no vitals taken for this visit. General:  alert  Skin:   no rash or abnormalities  Lungs:   clear to auscultation bilaterally  Heart:   regular rate and rhythm, S1, S2 normal, no murmur, click, rub or gallop  Breasts:   normal without suspicious masses, skin or nipple changes or axillary nodes  Abdomen:  normal findings: no organomegaly, soft, non-tender and no hernia  Pelvis:  External genitalia: normal general appearance Urinary system: urethral meatus normal and bladder without fullness, nontender Vaginal: normal without  tenderness, induration or masses Cervix: normal appearance Adnexa: normal bimanual exam Uterus: anteverted and non-tender, normal size   Lab Review Urine pregnancy test Labs reviewed yes Radiologic studies reviewed no  50% of 20 min visit spent on counseling and coordination of care.    Assessment:    Healthy female exam.    Recurrent BV  UTI Symptoms   Plan:    Wet prep and cultures ordered Urinalysis and C&S ordered   Education reviewed: calcium supplements, depression evaluation, low fat, low cholesterol diet, safe sex/STD prevention, self breast exams and weight bearing exercise. Contraception: not sure if she wants to continue IUD, considering other options. Follow up in: 2 weeks.    Orders Placed This Encounter  Procedures  . Urine culture    UTI symptoms      Patient ID: Joan Francis, female   DOB: [birth year], 26 y.o.   MRN: [id number]

## 2016-09-16 NOTE — Progress Notes (Signed)
Pt states that she would like IUD removed, pt states increase in d/c and irritation. Pt states she is having Migraines as well. Pt states that she will go back on pill.

## 2016-09-17 ENCOUNTER — Other Ambulatory Visit: Payer: Self-pay | Admitting: Obstetrics

## 2016-09-17 DIAGNOSIS — N760 Acute vaginitis: Principal | ICD-10-CM

## 2016-09-17 DIAGNOSIS — B9689 Other specified bacterial agents as the cause of diseases classified elsewhere: Secondary | ICD-10-CM

## 2016-09-17 LAB — CERVICOVAGINAL ANCILLARY ONLY
BACTERIAL VAGINITIS: POSITIVE — AB
Candida vaginitis: NEGATIVE
Chlamydia: NEGATIVE
Neisseria Gonorrhea: NEGATIVE
Trichomonas: NEGATIVE

## 2016-09-17 LAB — CYTOLOGY - PAP: Diagnosis: NEGATIVE

## 2016-09-17 MED ORDER — METRONIDAZOLE 500 MG PO TABS: 500.0000 mg | ORAL_TABLET | Freq: Two times a day (BID) | ORAL | 2 refills | Status: DC

## 2016-09-18 LAB — URINE CULTURE

## 2016-09-19 NOTE — Progress Notes (Signed)
Please make pt aware.

## 2016-09-20 NOTE — Progress Notes (Signed)
LVM for pt to c/b. Pt aware of BV and rx.

## 2016-10-07 ENCOUNTER — Encounter: Payer: Self-pay | Admitting: Obstetrics and Gynecology

## 2016-10-07 ENCOUNTER — Ambulatory Visit (INDEPENDENT_AMBULATORY_CARE_PROVIDER_SITE_OTHER): Payer: Managed Care, Other (non HMO) | Admitting: Obstetrics and Gynecology

## 2016-10-07 VITALS — BP 134/93 | HR 88 | Wt 145.4 lb

## 2016-10-07 DIAGNOSIS — Z3009 Encounter for other general counseling and advice on contraception: Secondary | ICD-10-CM

## 2016-10-07 DIAGNOSIS — Z30432 Encounter for removal of intrauterine contraceptive device: Secondary | ICD-10-CM | POA: Diagnosis not present

## 2016-10-07 MED ORDER — ETONOGESTREL-ETHINYL ESTRADIOL 0.12-0.015 MG/24HR VA RING: VAGINAL_RING | VAGINAL | 12 refills | Status: AC

## 2016-10-07 NOTE — Progress Notes (Signed)
26 yo [id number] here for IUD removal due to frequent migraine headaches without aura. Patient is interested in Nuvaring. She has used OCP in the past without complications. She is currently not sexually active  GYNECOLOGY CLINIC PROCEDURE NOTE  Georga BoraClayvonna Porter is a 26 y.o. [id number] here for IUD removal. No GYN concerns.  Last pap smear was on 09/2016 and was normal.  IUD Removal  Patient identified, informed consent performed, consent signed.  Patient was in the dorsal lithotomy position, normal external genitalia was noted.  A speculum was placed in the patient's vagina, normal discharge was noted, no lesions. The cervix was visualized, no lesions, no abnormal discharge.  The strings of the IUD were grasped and pulled using ring forceps. The IUD was removed in its entirety. Patient tolerated the procedure well.    Patient will use NuvaRing for contraception.  Routine preventative health maintenance measures emphasized.

## 2016-10-07 NOTE — Progress Notes (Signed)
Patient is in the office- she is requesting removal of her IUD. Patient is getting frequent migraines, back and leg pain, and BV. Patient is not sexually active at this time.

## 2017-05-05 ENCOUNTER — Ambulatory Visit: Payer: Managed Care, Other (non HMO) | Admitting: Obstetrics

## 2017-05-06 ENCOUNTER — Other Ambulatory Visit (HOSPITAL_COMMUNITY)
Admission: RE | Admit: 2017-05-06 | Discharge: 2017-05-06 | Disposition: A | Discharge status: Home / Self Care | Payer: Managed Care, Other (non HMO) | Source: Ambulatory Visit | Attending: Obstetrics | Admitting: Obstetrics

## 2017-05-06 ENCOUNTER — Encounter: Payer: Self-pay | Admitting: Obstetrics

## 2017-05-06 ENCOUNTER — Ambulatory Visit (INDEPENDENT_AMBULATORY_CARE_PROVIDER_SITE_OTHER): Payer: Managed Care, Other (non HMO) | Admitting: Obstetrics

## 2017-05-06 ENCOUNTER — Other Ambulatory Visit: Payer: Self-pay

## 2017-05-06 VITALS — BP 131/89 | HR 82 | Ht 63.5 in | Wt 155.5 lb

## 2017-05-06 DIAGNOSIS — B373 Candidiasis of vulva and vagina: Secondary | ICD-10-CM | POA: Diagnosis not present

## 2017-05-06 DIAGNOSIS — N898 Other specified noninflammatory disorders of vagina: Secondary | ICD-10-CM | POA: Diagnosis present

## 2017-05-06 MED ORDER — TERCONAZOLE 0.8 % VA CREA: 1.0000 | TOPICAL_CREAM | Freq: Every day | VAGINAL | 0 refills | Status: DC

## 2017-05-06 NOTE — Progress Notes (Signed)
Presents for vaginal discharge, itching and burning x 3 weeks. Denies odor, NV, pain, fever or chills.  Last PAP 09/16/16

## 2017-05-06 NOTE — Progress Notes (Signed)
Patient ID: Joan Francis, female   DOB: May 31, 1991, 26 y.o.   MRN: [id number]  Chief Complaint  Patient presents with  . Vaginal Discharge    HPI Joan Francis is a 26 y.o. female.  Vaginal discharge and itching,  Recently treated for BV. HPI  Past Medical History:  Diagnosis Date  . Asthma   . Headache     Past Surgical History:  Procedure Laterality Date  . NO PAST SURGERIES      History reviewed. No pertinent family history.  Social History Social History   Tobacco Use  . Smoking status: Never Smoker  . Smokeless tobacco: Never Used  Substance Use Topics  . Alcohol use: No  . Drug use: No    No Known Allergies  Current Outpatient Medications  Medication Sig Dispense Refill  . acetaminophen (TYLENOL) 325 MG tablet Take 2 tablets (650 mg total) by mouth every 4 (four) hours as needed (for pain scale < 4). (Patient not taking: Reported on 02/22/2016) 30 tablet 1  . albuterol (PROVENTIL HFA;VENTOLIN HFA) 108 (90 Base) MCG/ACT inhaler Inhale 2 puffs into the lungs every 6 (six) hours as needed for wheezing or shortness of breath. (Patient not taking: Reported on 02/22/2016) 1 Inhaler 2  . etonogestrel-ethinyl estradiol (NUVARING) 0.12-0.015 MG/24HR vaginal ring Insert vaginally and leave in place for 3 consecutive weeks, then remove for 1 week. (Patient not taking: Reported on 05/06/2017) 1 each 12  . ibuprofen (ADVIL,MOTRIN) 600 MG tablet Take 1 tablet (600 mg total) by mouth every 6 (six) hours. (Patient not taking: Reported on 02/22/2016) 30 tablet 0  . Prenatal MV & Min w/FA-DHA (PRENATAL ADULT GUMMY/DHA/FA PO) Take 2 each by mouth daily.     No current facility-administered medications for this visit.     Review of Systems Review of Systems Constitutional: negative for fatigue and weight loss Respiratory: negative for cough and wheezing Cardiovascular: negative for chest pain, fatigue and palpitations Gastrointestinal: negative for abdominal pain and change in  bowel habits Genitourinary:negative Integument/breast: negative for nipple discharge Musculoskeletal:negative for myalgias Neurological: negative for gait problems and tremors Behavioral/Psych: negative for abusive relationship, depression Endocrine: negative for temperature intolerance      Blood pressure 131/89, pulse 82, height 5' 3.5" (1.613 m), weight 155 lb 8 oz (70.5 kg), last menstrual period 04/25/2017, currently breastfeeding.  Physical Exam Physical Exam           General:  Alert and no distress Abdomen:  normal findings: no organomegaly, soft, non-tender and no hernia  Pelvis:  External genitalia: normal general appearance Urinary system: urethral meatus normal and bladder without fullness, nontender Vaginal: normal without tenderness, induration or masses Cervix: normal appearance Adnexa: normal bimanual exam Uterus: anteverted and non-tender, normal size    50% of 15 min visit spent on counseling and coordination of care.   Data Reviewed Wet prep Cultures  Assessment     1. Vaginal discharge Rx: - Cervicovaginal ancillary only - terconazole (TERAZOL 3) 0.8 % vaginal cream; Place 1 applicator vaginally at bedtime.  Dispense: 20 g; Refill: 0  2. Vaginal irritation - probable candida vaginitis  Plan    Follow up in 5 months for Annual and Pap   No orders of the defined types were placed in this encounter.  No orders of the defined types were placed in this encounter.

## 2017-05-07 LAB — CERVICOVAGINAL ANCILLARY ONLY
Bacterial vaginitis: NEGATIVE
CANDIDA VAGINITIS: POSITIVE — AB
Chlamydia: NEGATIVE
Neisseria Gonorrhea: NEGATIVE
TRICH (WINDOWPATH): NEGATIVE

## 2017-05-08 ENCOUNTER — Other Ambulatory Visit: Payer: Self-pay | Admitting: Obstetrics

## 2017-07-19 ENCOUNTER — Other Ambulatory Visit: Payer: Self-pay | Admitting: Obstetrics

## 2017-07-19 DIAGNOSIS — N898 Other specified noninflammatory disorders of vagina: Secondary | ICD-10-CM

## 2017-07-21 NOTE — Telephone Encounter (Signed)
Please review for refill.  

## 2017-07-22 MED ORDER — TERCONAZOLE 0.8 % VA CREA: 1.0000 | TOPICAL_CREAM | Freq: Every day | VAGINAL | 0 refills | Status: AC

## 2017-07-27 ENCOUNTER — Other Ambulatory Visit: Payer: Self-pay | Admitting: Obstetrics

## 2017-07-27 DIAGNOSIS — N898 Other specified noninflammatory disorders of vagina: Secondary | ICD-10-CM

## 2017-11-03 IMAGING — US US MFM OB COMP +14 WKS
1 series · 14 of 28 positions shown · non-contrast
Comparison: none

[Series 1: us mfm ob comp +14 wks · 114 acquisitions, 14 frames shown]
[im 5/114]
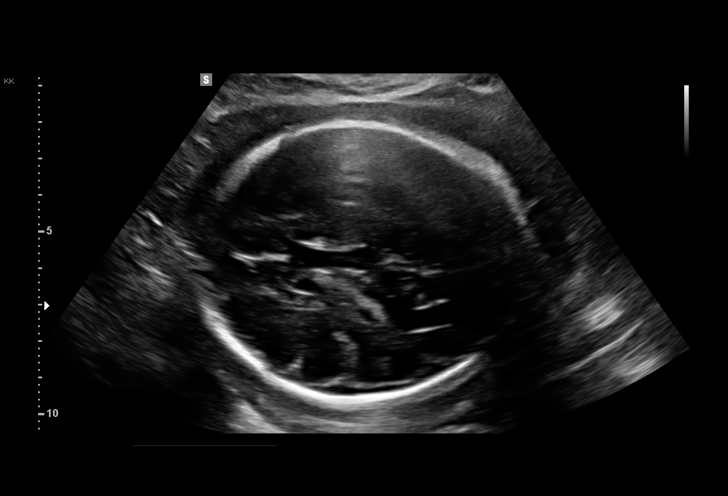
[im 13/114]
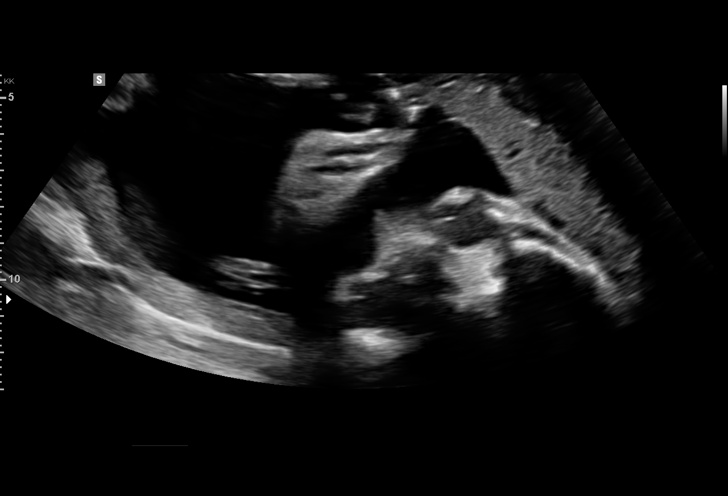
[im 21/114]
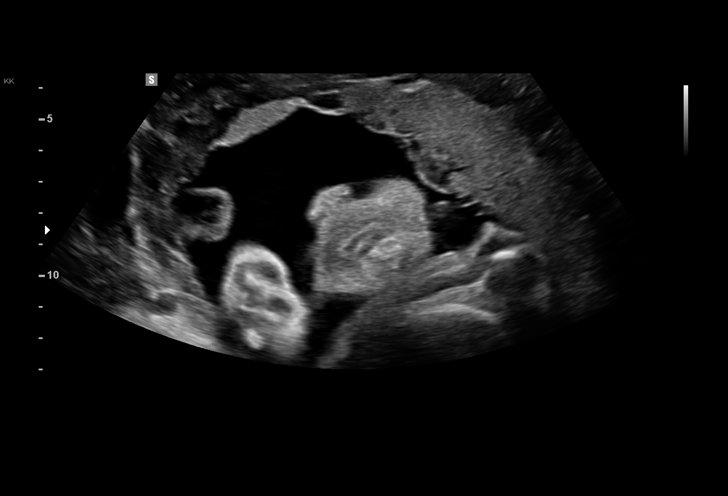
[im 30/114]
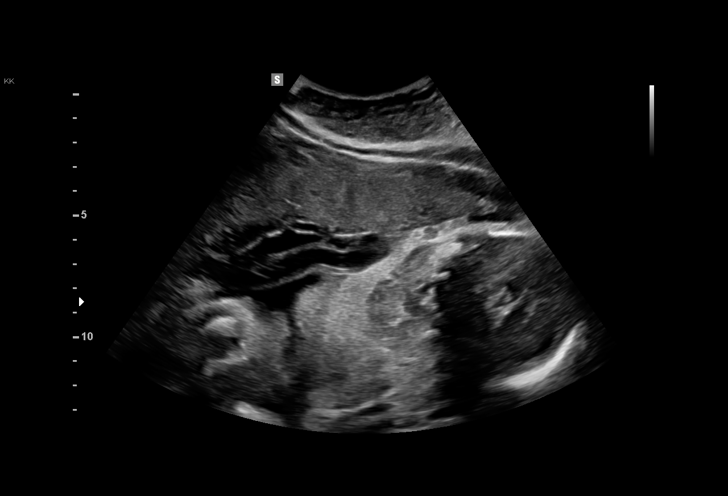
[im 38/114]
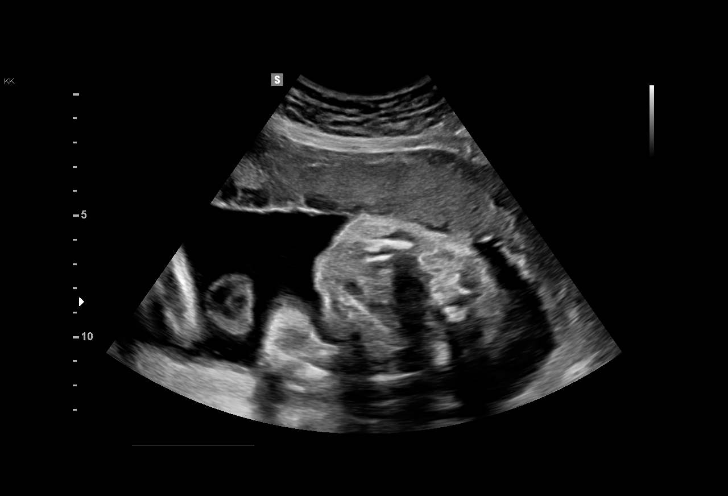
[im 47/114]
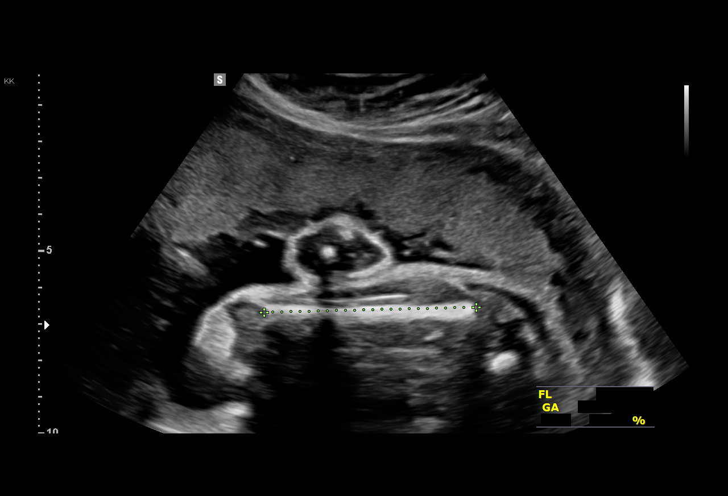
[im 55/114]
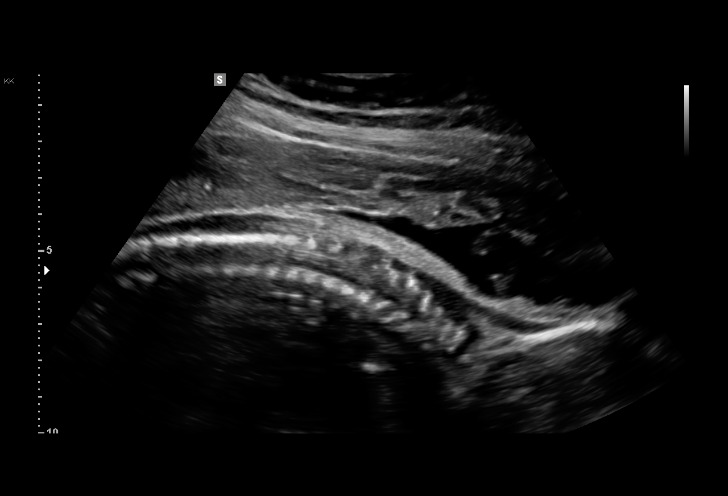
[im 63/114]
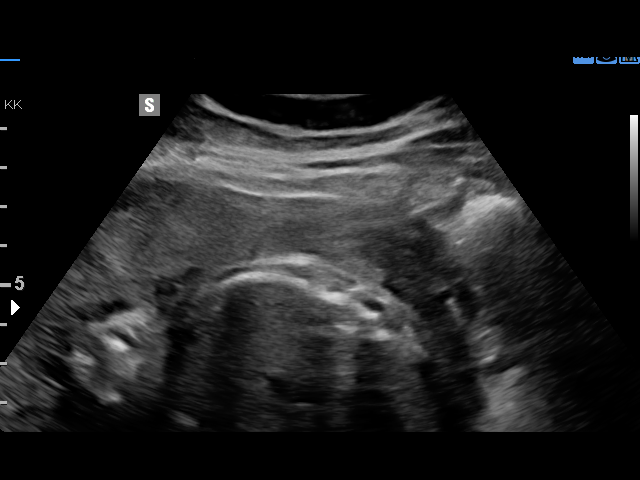
[im 72/114]
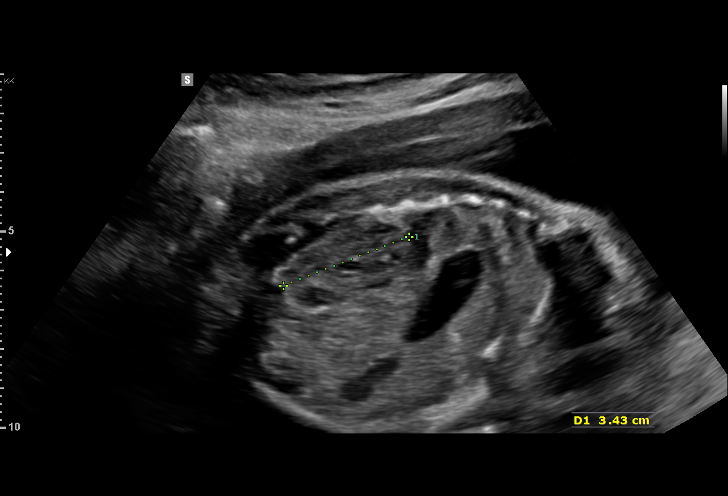
[im 80/114]
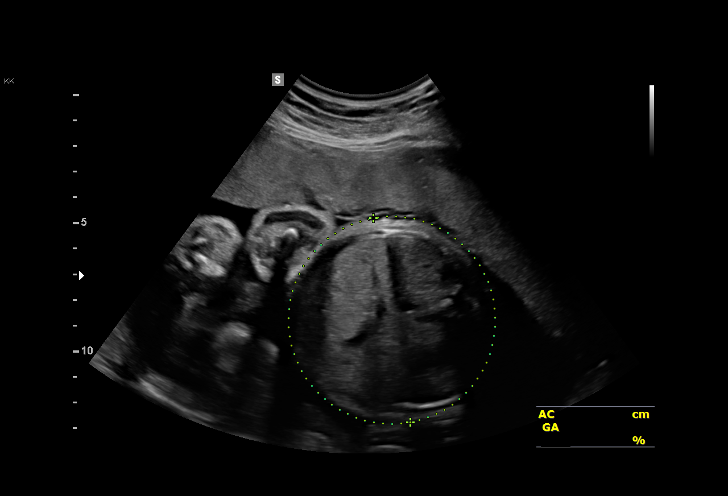
[im 88/114]
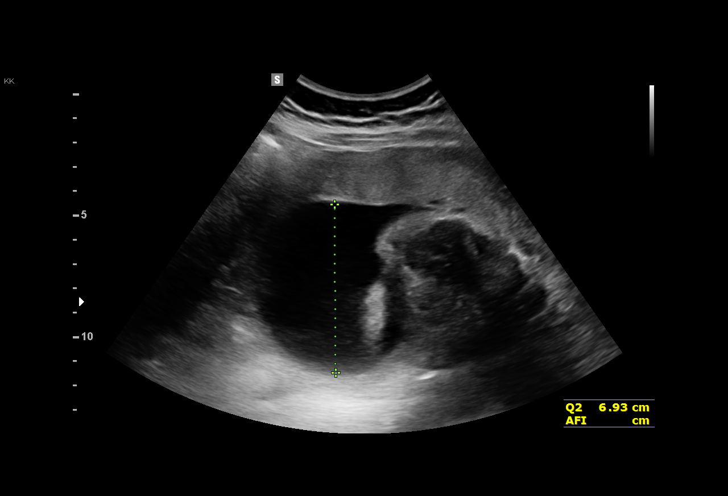
[im 97/114]
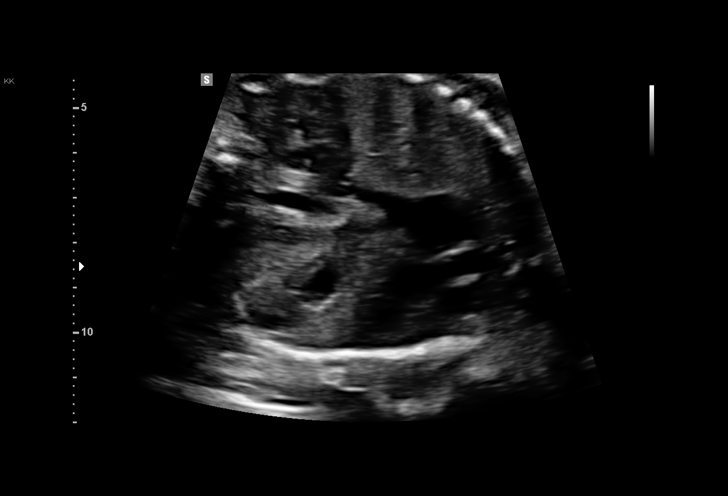
[im 105/114]
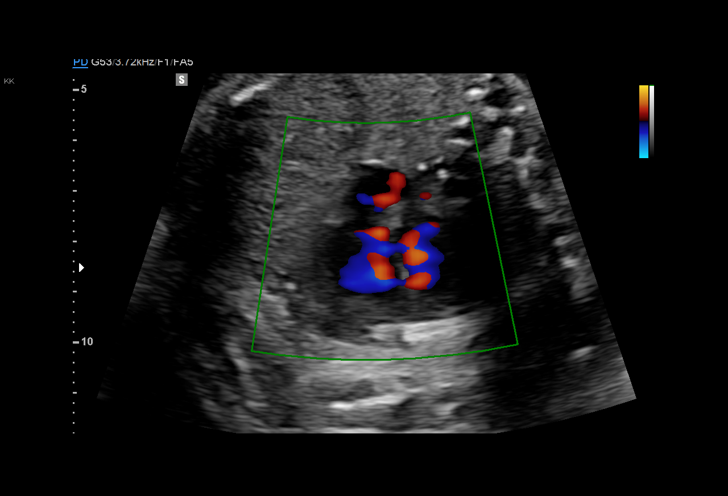
[im 114/114]
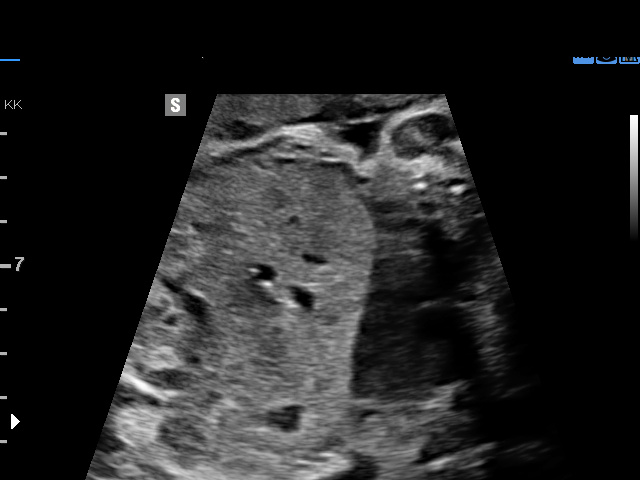

[14 of 28 positions shown; findings below may reference images not displayed]

Road [HOSPITAL]

Indications

29 weeks gestation of pregnancy
Basic anatomic survey                          Z36
Asthma                                         DAA.2A j61.282

OB History

Gravidity:    3         Term:   1        Prem:   0        SAB:   1
TOP:          0       Ectopic:  0        Living: 1
Fetal Evaluation

Num Of Fetuses:     1
Fetal Heart         145
Rate(bpm):
Cardiac Activity:   Observed
Presentation:       Cephalic
Placenta:           Anterior, above cervical os
P. Cord Insertion:  Visualized

Amniotic Fluid
AFI FV:      Subjectively within normal limits

AFI Sum(cm)     %Tile       Largest Pocket(cm)
21.63           87

RUQ(cm)       RLQ(cm)       LUQ(cm)        LLQ(cm)
5.64
Biometry
BPD:      74.1  mm     G. Age:  29w 5d         57  %    CI:        73.14   %    70 - 86
FL/HC:       21.3  %    19.6 -
HC:      275.4  mm     G. Age:  30w 1d         45  %    HC/AC:       1.08       0.99 -
AC:      254.2  mm     G. Age:  29w 4d         58  %    FL/BPD:      79.1  %    71 - 87
FL:       58.6  mm     G. Age:  30w 4d         76  %    FL/AC:       23.1  %    20 - 24

Est. FW:    3617   gm     3 lb 5 oz     68  %
Gestational Age

LMP:           29w 1d        Date:  04/16/15                 EDD:   01/21/16
U/S Today:     30w 0d                                        EDD:   01/15/16
Best:          29w 1d     Det. By:  LMP  (04/16/15)          EDD:   01/21/16
Anatomy

Cranium:               Appears normal         Aortic Arch:            Appears normal
Cavum:                 Appears normal         Ductal Arch:            Appears normal
Ventricles:            Appears normal         Diaphragm:              Appears normal
Choroid Plexus:        Appears normal         Stomach:                Appears normal, left
sided
Cerebellum:            Appears normal         Abdomen:                Appears normal
Posterior Fossa:       Appears normal         Abdominal Wall:         Appears nml (cord
insert, abd wall)
Nuchal Fold:           Not applicable (>20    Cord Vessels:           Appears normal (3
wks GA)                                        vessel cord)
Face:                  Orbits appear          Kidneys:                Appear normal
normal
Lips:                  Appears normal         Bladder:                Appears normal
Thoracic:              Appears normal         Spine:                  Appears normal
Heart:                 Appears normal         Upper Extremities:      Appears normal
(4CH, axis, and situs
RVOT:                  Appears normal         Lower Extremities:      Appears normal
LVOT:                  Appears normal

Other:  Male gender. Heels visualized. Technically difficult due to fetal
position.
Cervix Uterus Adnexa

Cervix
Length:              4  cm.
Normal appearance by transabdominal scan.
Impression

Single IUP at 29w 1d
Late onset of care
Normal fetal anatomic survey
The estimated fetal weight is at the 68th %tile
Anterior placenta without previa
Normal amniotic fluid volume
Recommendations

Recommend follow up ultrasound for growth in 4 weeks due
to late onset of care

## 2017-12-01 IMAGING — US US MFM OB FOLLOW-UP
1 series · 14 of 28 positions shown · non-contrast
Comparison: none

[Series 1: us mfm ob follow-up · 53 acquisitions, 14 frames shown]
[im 2/53]
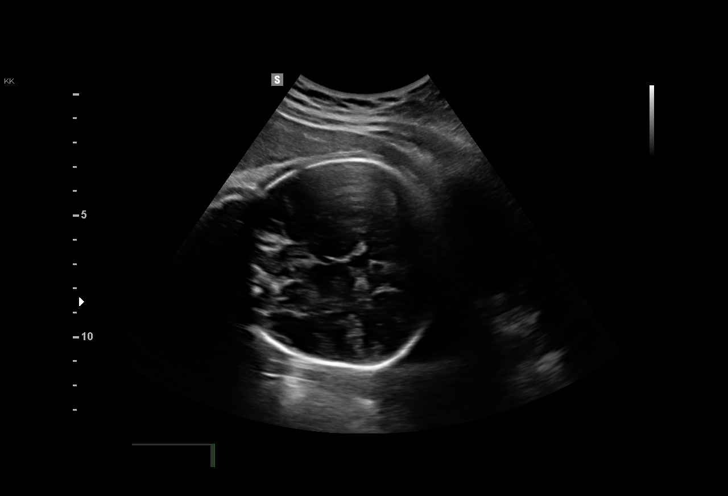
[im 6/53]
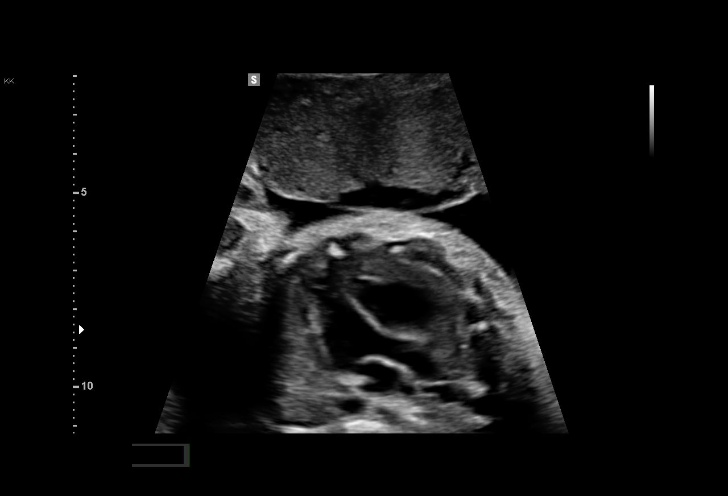
[im 10/53]
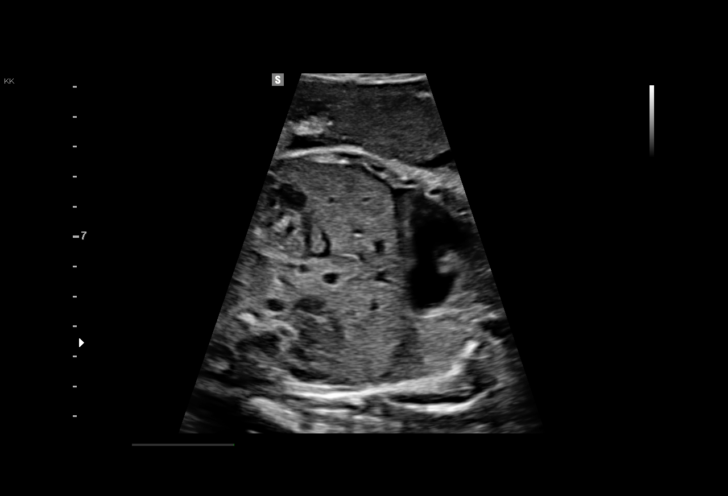
[im 14/53]
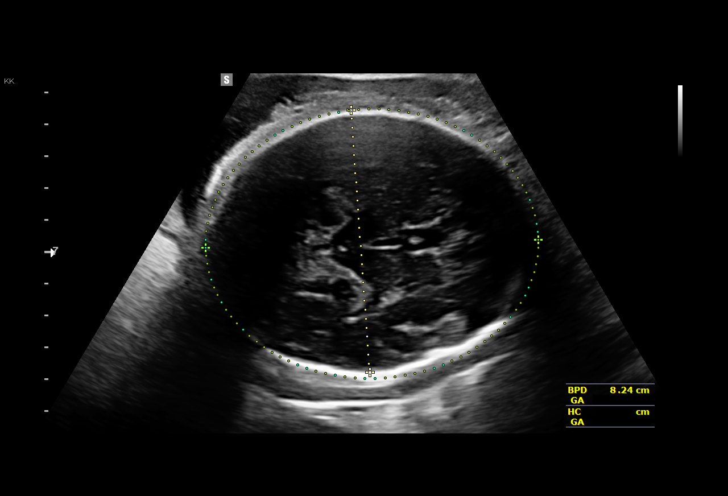
[im 18/53]
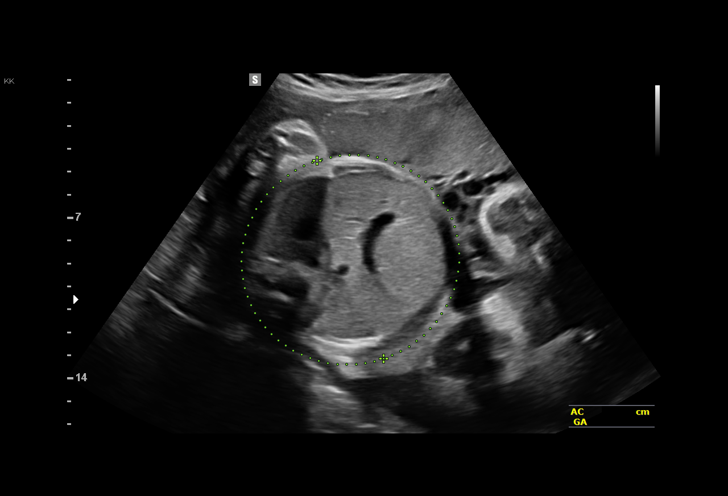
[im 22/53]
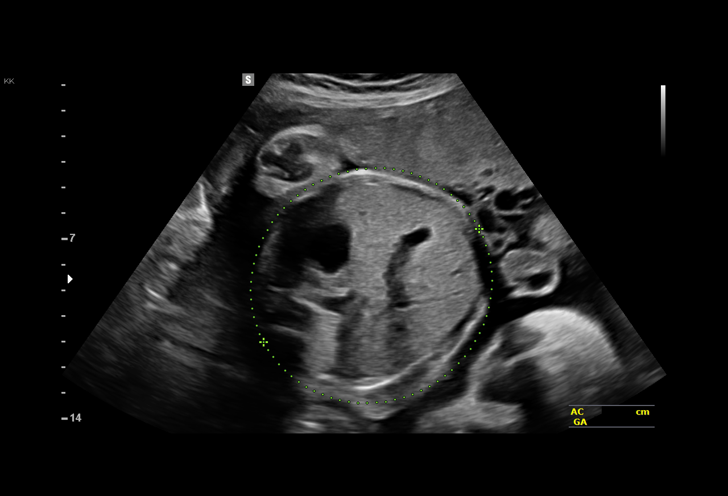
[im 26/53]
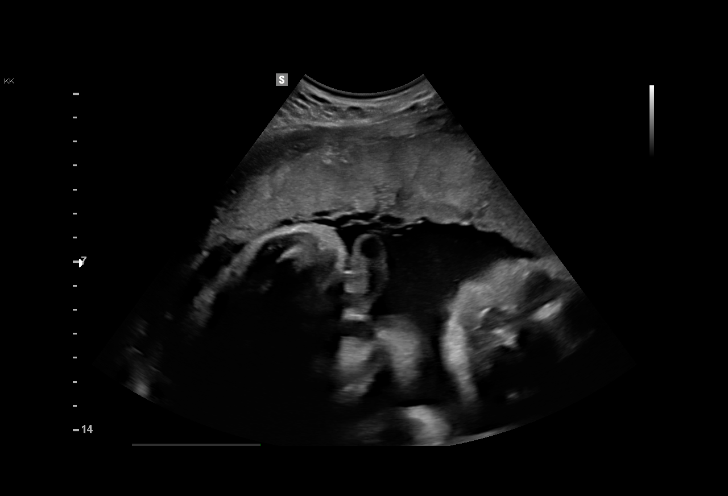
[im 29/53]
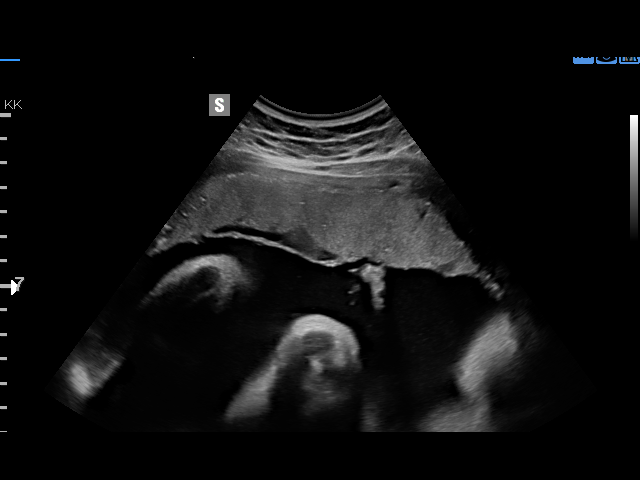
[im 33/53]
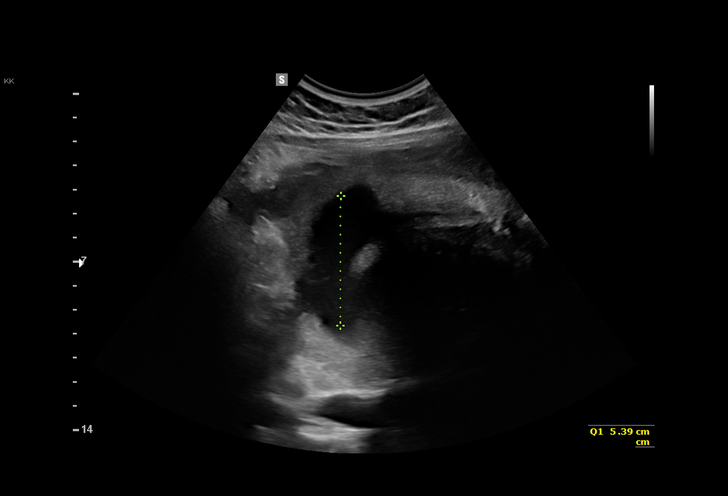
[im 37/53]
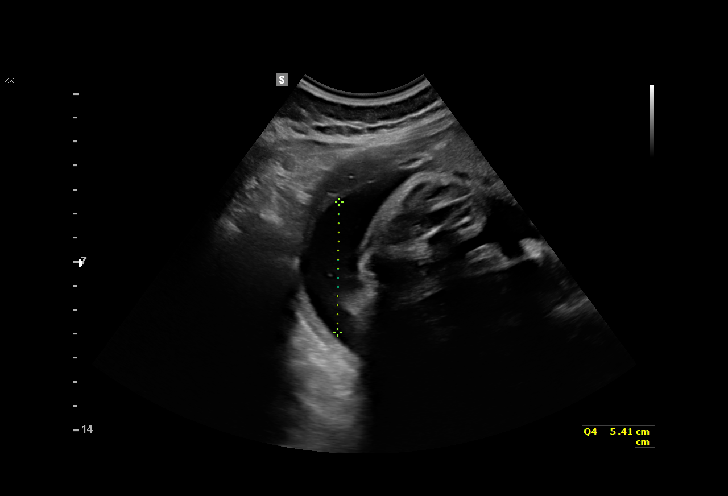
[im 41/53]
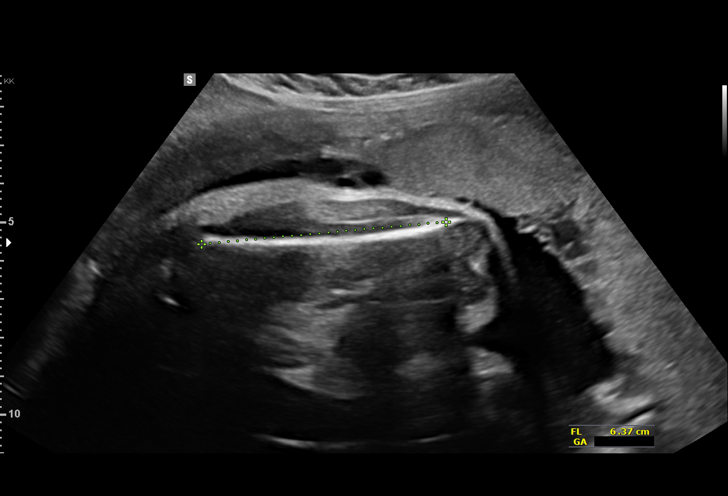
[im 45/53]
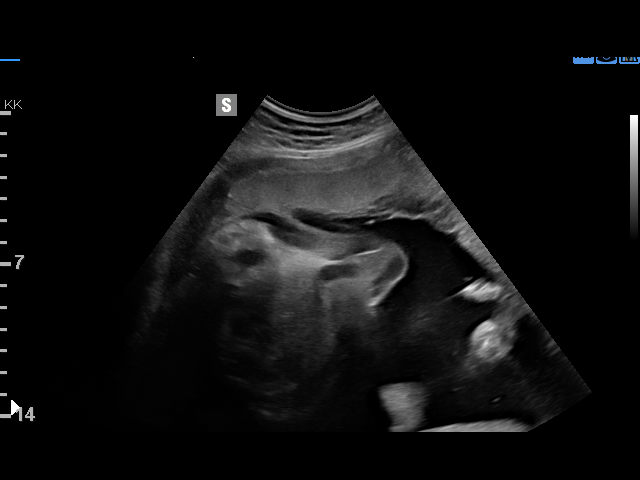
[im 49/53]
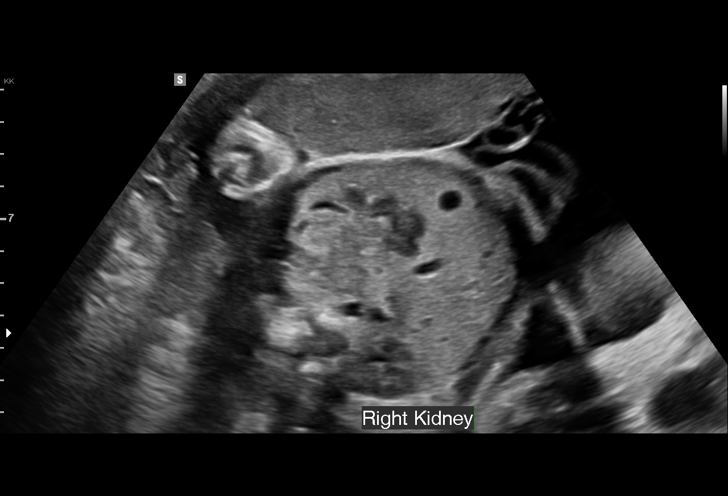
[im 53/53]
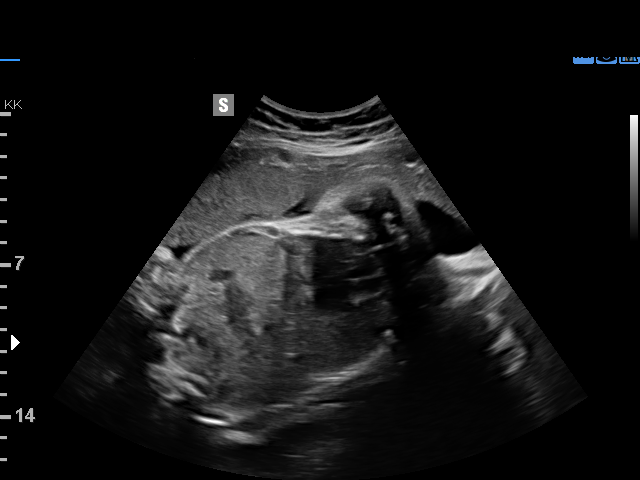

[14 of 28 positions shown; findings below may reference images not displayed]

Road [HOSPITAL]

Indications

33 weeks gestation of pregnancy
Asthma                                         KLL.4L j82.515
Late to prenatal care, third trimester
Follow-up incomplete fetal anatomic            Z36
evaluation
OB History

Gravidity:    3         Term:   1        Prem:   0        SAB:   1
TOP:          0       Ectopic:  0        Living: 1
Fetal Evaluation

Num Of Fetuses:     1
Fetal Heart         157
Rate(bpm):
Cardiac Activity:   Observed
Presentation:       Cephalic
Placenta:           Anterior, above cervical os
P. Cord Insertion:  Previously Visualized

Amniotic Fluid
AFI FV:      Subjectively within normal limits

AFI Sum(cm)     %Tile       Largest Pocket(cm)
23.38           90

RUQ(cm)       RLQ(cm)       LUQ(cm)        LLQ(cm)
5.39
Biometry

BPD:      82.7  mm     G. Age:  33w 2d         48  %    CI:        77.44   %   70 - 86
FL/HC:      21.5   %   19.9 -
HC:      297.5  mm     G. Age:  33w 0d         12  %    HC/AC:      1.02       0.96 -
AC:      292.6  mm     G. Age:  33w 2d         55  %    FL/BPD:     77.3   %   71 - 87
FL:       63.9  mm     G. Age:  33w 0d         35  %    FL/AC:      21.8   %   20 - 24

Est. FW:    6756  gm    4 lb 11 oz      58  %
Gestational Age

LMP:           33w 1d       Date:   04/16/15                 EDD:   01/21/16
U/S Today:     33w 1d                                        EDD:   01/21/16
Best:          33w 1d    Det. By:   LMP  (04/16/15)          EDD:   01/21/16
Anatomy

Cranium:               Appears normal         Aortic Arch:            Previously seen
Cavum:                 Appears normal         Ductal Arch:            Previously seen
Ventricles:            Appears normal         Diaphragm:              Appears normal
Choroid Plexus:        Previously seen        Stomach:                Appears normal, left
sided
Cerebellum:            Previously seen        Abdomen:                Appears normal
Posterior Fossa:       Previously seen        Abdominal Wall:         Previously seen
Nuchal Fold:           Not applicable (>20    Cord Vessels:           Previously seen
wks GA)
Face:                  Orbits prev.           Kidneys:                Appear normal
seen,Profile wnl
Lips:                  Appears normal         Bladder:                Appears normal
Thoracic:              Appears normal         Spine:                  Previously seen
Heart:                 Appears normal         Upper Extremities:      Previously seen
(4CH, axis, and situs
RVOT:                  Appears normal         Lower Extremities:      Previously seen
LVOT:                  Appears normal

Other:  Male gender. Heels previously visualized. Technically difficult due to
fetal position.
Cervix Uterus Adnexa

Cervix
Not visualized (advanced GA >49wks)
Impression

Single IUP at 33w 1d
Normal interval anatomy
Fetal growth is appropriate (58th %tile)
Anterior placenta without previa
Normal amniotic fluid volume
Recommendations

Follow-up ultrasounds as clinically indicated.
